# Patient Record
Sex: Female | Born: 1990 | Race: Black or African American | Hispanic: No | Marital: Single | Smoking: Former smoker
Health system: Southern US, Community
[De-identification: ages and names within clinical notes are randomized; demographics above are authoritative.]

## PROBLEM LIST (undated history)

## (undated) DIAGNOSIS — J40 Bronchitis, not specified as acute or chronic: Secondary | ICD-10-CM

## (undated) HISTORY — PX: INNER EAR SURGERY: SHX679

## (undated) HISTORY — PX: WISDOM TOOTH EXTRACTION: SHX21

## (undated) HISTORY — PX: EYE SURGERY: SHX253

---

## 2016-08-06 DIAGNOSIS — J209 Acute bronchitis, unspecified: Secondary | ICD-10-CM | POA: Insufficient documentation

## 2016-08-06 DIAGNOSIS — R079 Chest pain, unspecified: Secondary | ICD-10-CM | POA: Diagnosis present

## 2016-08-07 ENCOUNTER — Emergency Department (HOSPITAL_BASED_OUTPATIENT_CLINIC_OR_DEPARTMENT_OTHER): Payer: Medicaid Other

## 2016-08-07 ENCOUNTER — Emergency Department (HOSPITAL_BASED_OUTPATIENT_CLINIC_OR_DEPARTMENT_OTHER)
Admission: EM | Admit: 2016-08-07 | Discharge: 2016-08-07 | Disposition: A | Discharge status: Home / Self Care | Payer: Medicaid Other | Attending: Emergency Medicine | Admitting: Emergency Medicine

## 2016-08-07 ENCOUNTER — Encounter (HOSPITAL_BASED_OUTPATIENT_CLINIC_OR_DEPARTMENT_OTHER): Payer: Self-pay | Admitting: *Deleted

## 2016-08-07 DIAGNOSIS — J209 Acute bronchitis, unspecified: Secondary | ICD-10-CM

## 2016-08-07 MED ORDER — IPRATROPIUM-ALBUTEROL 0.5-2.5 (3) MG/3ML IN SOLN
3.0000 mL | RESPIRATORY_TRACT | Status: DC
  Administered 2016-08-07: 3 mL via RESPIRATORY_TRACT
  Filled 2016-08-07: qty 3

## 2016-08-07 MED ORDER — NAPROXEN 250 MG PO TABS
500.0000 mg | ORAL_TABLET | Freq: Once | ORAL | Status: AC
  Administered 2016-08-07: 500 mg via ORAL
  Filled 2016-08-07: qty 2

## 2016-08-07 MED ORDER — ALBUTEROL SULFATE HFA 108 (90 BASE) MCG/ACT IN AERS
2.0000 | INHALATION_SPRAY | RESPIRATORY_TRACT | Status: DC | PRN
  Administered 2016-08-07: 2 via RESPIRATORY_TRACT
  Filled 2016-08-07: qty 6.7

## 2016-08-07 NOTE — ED Triage Notes (Signed)
Pt was asleep in the chair in triage. States that she has had nausea, CP, HA x several days.

## 2016-08-07 NOTE — ED Notes (Signed)
Pt alert, NAD, calm, interactive, resps e/u, no dyspnea noted, resting comfortably, "feel much better", denies pain.

## 2016-08-07 NOTE — ED Notes (Signed)
Pt discharged to home NAD.  

## 2016-08-07 NOTE — ED Notes (Signed)
RT at BS.

## 2016-08-07 NOTE — ED Provider Notes (Signed)
[facility] Provider Note: Teresa DellJ. Lane Palmer Shorey, MD, FACEP  CSN: [id number] MRN: [id number] ARRIVAL: 08/06/16 at 2347 ROOM: [facility]   CHIEF COMPLAINT  Chest Pain   HISTORY OF PRESENT ILLNESS  Teresa Bradley is a 26 y.o. female who developed a respiratory illness about a week and a half ago. This is characterized by fever, cough, nasal congestion, vomiting and malaise. She self medicated with over-the-counter medications and was improving. About 4 days ago she worsened with nausea, vomiting, nonproductive cough, headache, shortness of breath and chest pain. The chest pain is located in the left upper chest anteriorly. The pain is described as dull, moderate and worse with coughing or deep breath. Her nausea and vomiting have subsequently resolved.   History reviewed. No pertinent past medical history.  History reviewed. No pertinent surgical history.  History reviewed. No pertinent family history.  Social History  Substance Use Topics  . Smoking status: Never Smoker  . Smokeless tobacco: Never Used  . Alcohol use No    Prior to Admission medications   Not on File    Allergies Patient has no known allergies.   REVIEW OF SYSTEMS  Negative except as noted here or in the History of Present Illness.   PHYSICAL EXAMINATION  Initial Vital Signs Blood pressure 112/74, pulse (!) 56, temperature 98.2 F (36.8 C), temperature source Oral, resp. rate 15, height 5\' 5"  (1.651 m), weight 200 lb (90.7 kg), SpO2 100 %.  Examination General: Well-developed, well-nourished female in no acute distress; appearance consistent with age of record HENT: normocephalic; atraumatic Eyes: pupils equal, round and reactive to light; extraocular muscles intact Neck: supple Heart: regular rate and rhythm Lungs: Decreased air movement bilaterally Chest: Nontender Abdomen: soft; nondistended; nontender; no masses or hepatosplenomegaly; bowel sounds present Extremities: No deformity; full  range of motion; pulses normal Neurologic: Awake, alert and oriented; motor function intact in all extremities and symmetric; no facial droop Skin: Warm and dry Psychiatric: Normal mood and affect   RESULTS  Summary of this visit's results, reviewed by myself:   EKG Interpretation  Date/Time:  Sunday August 07 2016 00:15:05 EST Ventricular Rate:  72 PR Interval:  126 QRS Duration: 70 QT Interval:  372 QTC Calculation: 407 R Axis:   77 Text Interpretation:  Normal sinus rhythm No previous ECGs available Confirmed by Marisella Puccio  MD, Jonny RuizJOHN ([id number]) on 08/07/2016 12:25:23 AM      Laboratory Studies: No results found for this or any previous visit (from the past 24 hour(s)). Imaging Studies: Dg Chest 2 View  Result Date: 08/07/2016 CLINICAL DATA:  Nausea, chest pain, shortness of breath, and headache for several days. EXAM: CHEST  2 VIEW COMPARISON:  None. FINDINGS: The heart size and mediastinal contours are within normal limits. Both lungs are clear. The visualized skeletal structures are unremarkable. IMPRESSION: No active cardiopulmonary disease. Electronically Signed   By: Burman NievesWilliam  Stevens M.D.   On: 08/07/2016 04:11    ED COURSE  Nursing notes and initial vitals signs, including pulse oximetry, reviewed.  Vitals:   08/07/16 0012 08/07/16 0013 08/07/16 0218 08/07/16 0407  BP: 115/75  112/74   Pulse: 73  (!) 56   Resp: 16  15   Temp: 98.2 F (36.8 C)     TempSrc: Oral     SpO2: 100%  100% 100%  Weight:  200 lb (90.7 kg)    Height:  5\' 5"  (1.651 m)     4:27 AM Air movement improved and patient feels significantly better after  DuoNeb treatment.  PROCEDURES    ED DIAGNOSES     ICD-9-CM ICD-10-CM   1. Acute bronchitis with bronchospasm 466.0 J20.9        Paula Libra, MD 08/07/16 [phone]

## 2017-04-29 ENCOUNTER — Emergency Department (HOSPITAL_BASED_OUTPATIENT_CLINIC_OR_DEPARTMENT_OTHER): Payer: BLUE CROSS/BLUE SHIELD

## 2017-04-29 ENCOUNTER — Emergency Department (HOSPITAL_BASED_OUTPATIENT_CLINIC_OR_DEPARTMENT_OTHER)
Admission: EM | Admit: 2017-04-29 | Discharge: 2017-04-29 | Disposition: A | Discharge status: Home / Self Care | Payer: BLUE CROSS/BLUE SHIELD | Attending: Physician Assistant | Admitting: Physician Assistant

## 2017-04-29 ENCOUNTER — Encounter (HOSPITAL_BASED_OUTPATIENT_CLINIC_OR_DEPARTMENT_OTHER): Payer: Self-pay | Admitting: Emergency Medicine

## 2017-04-29 DIAGNOSIS — O200 Threatened abortion: Secondary | ICD-10-CM | POA: Diagnosis not present

## 2017-04-29 DIAGNOSIS — Z87891 Personal history of nicotine dependence: Secondary | ICD-10-CM | POA: Diagnosis not present

## 2017-04-29 DIAGNOSIS — Z3A01 Less than 8 weeks gestation of pregnancy: Secondary | ICD-10-CM | POA: Diagnosis not present

## 2017-04-29 DIAGNOSIS — O4690 Antepartum hemorrhage, unspecified, unspecified trimester: Secondary | ICD-10-CM

## 2017-04-29 DIAGNOSIS — O4691 Antepartum hemorrhage, unspecified, first trimester: Secondary | ICD-10-CM | POA: Diagnosis present

## 2017-04-29 LAB — WET PREP, GENITAL
Sperm: NONE SEEN
Trich, Wet Prep: NONE SEEN
Yeast Wet Prep HPF POC: NONE SEEN

## 2017-04-29 LAB — ABO/RH: ABO/RH(D): A POS

## 2017-04-29 LAB — HCG, QUANTITATIVE, PREGNANCY: HCG, BETA CHAIN, QUANT, S: 6 m[IU]/mL — AB (ref <5)

## 2017-04-29 NOTE — ED Notes (Signed)
Pt unable to provide urine sample.

## 2017-04-29 NOTE — ED Triage Notes (Signed)
Pt c/o vaginal bleeding intermittently since Tues; c/o lower abd cramping; + home preg test

## 2017-04-29 NOTE — Discharge Instructions (Signed)
Please follow up with your OBGYN.  Your hormone levels are very low which makes us concerned that you are likely loosing this pregnancy, or it is VERY early.  We want you to follow up for repeat levels this week with your OBGYN.

## 2017-04-29 NOTE — ED Notes (Signed)
Patient transported to Ultrasound 

## 2017-04-29 NOTE — ED Notes (Signed)
PT returned from US.

## 2017-04-29 NOTE — ED Provider Notes (Signed)
[facility] EMERGENCY DEPARTMENT Provider Note   CSN: [id number] Arrival date & time: 04/29/17  1055     History   Chief Complaint Chief Complaint  Patient presents with  . Vaginal Bleeding    HPI Teresa Bradley is a 26 y.o. female.  HPI  Pt is a 26 yo female presenting with vaginal bleeding and new pregnancy. Pt has had one miscarriage prior to this pregnancy.  Patient believes that she is less than [redacted] weeks pregnant.  She called her OB who sent her here for evaluation.  Patient unsure of her blood type.  Patient had a abdominal cramping and bleeding starting 2 days ago.  It got worse and she had several blood clots today.    History reviewed. No pertinent past medical history.  There are no active problems to display for this patient.   Past Surgical History:  Procedure Laterality Date  . EYE SURGERY    . INNER EAR SURGERY      OB History    Gravida Para Term Preterm AB Living   1             SAB TAB Ectopic Multiple Live Births                   Home Medications    Prior to Admission medications   Not on File    Family History No family history on file.  Social History Social History  Substance Use Topics  . Smoking status: Former Games developer  . Smokeless tobacco: Never Used  . Alcohol use No     Allergies   Patient has no known allergies.   Review of Systems Review of Systems  Constitutional: Negative for activity change.  Respiratory: Negative for shortness of breath.   Cardiovascular: Negative for chest pain.  Gastrointestinal: Positive for abdominal pain.  Genitourinary: Positive for vaginal bleeding.  All other systems reviewed and are negative.    Physical Exam Updated Vital Signs BP 107/66 (BP Location: Right Arm)   Pulse 64   Temp 98.5 F (36.9 C) (Oral)   Resp 18   Ht 5\' 5"  (1.651 m)   Wt 90.7 kg (200 lb)   LMP 03/07/2017   SpO2 100%   BMI 33.28 kg/m   Physical Exam  Constitutional: She is oriented to person,  place, and time. She appears well-developed and well-nourished.  HENT:  Head: Normocephalic and atraumatic.  Eyes: Right eye exhibits no discharge.  Cardiovascular: Normal rate, regular rhythm and normal heart sounds.   No murmur heard. Pulmonary/Chest: Effort normal and breath sounds normal. She has no wheezes. She has no rales.  Abdominal: Soft. She exhibits no distension. There is no tenderness.  Genitourinary: Vagina normal.  Genitourinary Comments: Cervix closed, blood in vault.  Neurological: She is oriented to person, place, and time.  Skin: Skin is warm and dry. She is not diaphoretic.  Psychiatric: She has a normal mood and affect.  Nursing note and vitals reviewed.    ED Treatments / Results  Labs (all labs ordered are listed, but only abnormal results are displayed) Labs Reviewed  WET PREP, GENITAL - Abnormal; Notable for the following:       Result Value   Clue Cells Wet Prep HPF POC PRESENT (*)    WBC, Wet Prep HPF POC MODERATE (*)    All other components within normal limits  HCG, QUANTITATIVE, PREGNANCY - Abnormal; Notable for the following:    hCG, Beta Chain, Quant, S 6 (*)  All other components within normal limits  RPR  HIV ANTIBODY (ROUTINE TESTING)  ABO/RH  GC/CHLAMYDIA PROBE AMP ([place]) NOT AT [facility]    EKG  EKG Interpretation None       Radiology US Ob Comp Less 14 Wks  Result Date: 04/29/2017 CLINICAL DATA:  26 year old female with vaginal bleeding and cramping for 4 days. The patient's LMP was 12/12/2017. EXAM: ULTRASOUND PELVIS TRANSVAGINAL TECHNIQUE: Transvaginal ultrasound examination of the pelvis was performed including evaluation of the uterus, ovaries, adnexal regions, and pelvic cul-de-sac. COMPARISON:  None. FINDINGS: Uterus Measurements: 8.2 x 5.9 x 5.0 cm. No intrauterine gestational sac or fetal pole is identified. No fibroids or other mass visualized. Endometrium Thickness: 0.7 cm. No intrauterine gestational sac identified. No  other focal abnormality visualized. Right ovary Measurements: 3.7 x 2.5 x 1.9 cm. Normal appearance/no adnexal mass. Left ovary Measurements: 3.2 x 2.1 x 1.4 cm. Normal appearance/no adnexal mass. Other findings: No abnormal free fluid. Note is made of a few small nabothian cysts. IMPRESSION: 1. No sonographic evidence for intrauterine gestational sac. No abnormal pelvic free fluid or adnexal masses identified. Differential includes early nonvisualized pregnancy, recent miscarriage or nonvisualized ectopic pregnancy. 2. Recommend close clinical follow-up and serial beta hCGs. Electronically Signed   By: Sande Brothers M.D.   On: 04/29/2017 12:41   US Ob Transvaginal  Result Date: 04/29/2017 CLINICAL DATA:  26 year old female with vaginal bleeding and cramping for 4 days. The patient's LMP was 12/12/2017. EXAM: ULTRASOUND PELVIS TRANSVAGINAL TECHNIQUE: Transvaginal ultrasound examination of the pelvis was performed including evaluation of the uterus, ovaries, adnexal regions, and pelvic cul-de-sac. COMPARISON:  None. FINDINGS: Uterus Measurements: 8.2 x 5.9 x 5.0 cm. No intrauterine gestational sac or fetal pole is identified. No fibroids or other mass visualized. Endometrium Thickness: 0.7 cm. No intrauterine gestational sac identified. No other focal abnormality visualized. Right ovary Measurements: 3.7 x 2.5 x 1.9 cm. Normal appearance/no adnexal mass. Left ovary Measurements: 3.2 x 2.1 x 1.4 cm. Normal appearance/no adnexal mass. Other findings: No abnormal free fluid. Note is made of a few small nabothian cysts. IMPRESSION: 1. No sonographic evidence for intrauterine gestational sac. No abnormal pelvic free fluid or adnexal masses identified. Differential includes early nonvisualized pregnancy, recent miscarriage or nonvisualized ectopic pregnancy. 2. Recommend close clinical follow-up and serial beta hCGs. Electronically Signed   By: Sande Brothers M.D.   On: 04/29/2017 12:41    Procedures Procedures  (including critical care time)  Medications Ordered in ED Medications - No data to display   Initial Impression / Assessment and Plan / ED Course  I have reviewed the triage vital signs and the nursing notes.  Pertinent labs & imaging results that were available during my care of the patient were reviewed by me and considered in my medical decision making (see chart for details).     Pt is a 26 yo female presenting with vaginal bleeding and new pregnancy. Pt has had one miscarriage prior to this pregnancy.  Patient believes that she is less than [redacted] weeks pregnant.  She called her OB who sent her here for evaluation.  Patient unsure of her blood type.  Patient had a abdominal cramping and bleeding starting 2 days ago.  It got worse and she had several blood clots today.   3:17 PM Will get blood type,beta hcg, transvaginal ultrasound.  Likely threatened miscarriage.  Very low hcg, transitional shows no IUP, likely a threatened/near complete miscarriage we will have her follow-up with OB/GYN this week.  Patient B+, no RhoGam needed.  Final Clinical Impressions(s) / ED Diagnoses   Final diagnoses:  Vaginal bleeding in pregnancy  Threatened miscarriage    New Prescriptions There are no discharge medications for this patient.    Abelino DerrickMackuen, Quinci Gavidia Lyn, MD 04/29/17 1517

## 2017-04-30 LAB — HIV ANTIBODY (ROUTINE TESTING W REFLEX): HIV Screen 4th Generation wRfx: NONREACTIVE

## 2017-04-30 LAB — RPR: RPR Ser Ql: NONREACTIVE

## 2017-05-01 LAB — GC/CHLAMYDIA PROBE AMP (~~LOC~~) NOT AT ARMC
Chlamydia: NEGATIVE
NEISSERIA GONORRHEA: NEGATIVE

## 2018-06-07 ENCOUNTER — Emergency Department (HOSPITAL_BASED_OUTPATIENT_CLINIC_OR_DEPARTMENT_OTHER): Payer: Medicaid Other

## 2018-06-07 ENCOUNTER — Other Ambulatory Visit: Payer: Self-pay

## 2018-06-07 ENCOUNTER — Emergency Department (HOSPITAL_BASED_OUTPATIENT_CLINIC_OR_DEPARTMENT_OTHER)
Admission: EM | Admit: 2018-06-07 | Discharge: 2018-06-07 | Disposition: A | Discharge status: Home / Self Care | Payer: Medicaid Other | Attending: Emergency Medicine | Admitting: Emergency Medicine

## 2018-06-07 ENCOUNTER — Encounter (HOSPITAL_BASED_OUTPATIENT_CLINIC_OR_DEPARTMENT_OTHER): Payer: Self-pay | Admitting: *Deleted

## 2018-06-07 DIAGNOSIS — R05 Cough: Secondary | ICD-10-CM

## 2018-06-07 DIAGNOSIS — R059 Cough, unspecified: Secondary | ICD-10-CM

## 2018-06-07 DIAGNOSIS — R0981 Nasal congestion: Secondary | ICD-10-CM

## 2018-06-07 NOTE — Discharge Instructions (Signed)
It was my pleasure taking care of you today!   Fortunately, your x-ray was normal. We did not see evidence of serious infection and can treat your symptoms. Flonase for nasal congestion.   Rest, drink plenty of fluids to be sure you are staying hydrated.   Please follow up with your primary doctor for discussion of your diagnoses and further evaluation after today's visit if symptoms persist longer than 7 days; Return to the ER for high fevers, difficulty breathing or other concerning symptoms

## 2018-06-07 NOTE — ED Provider Notes (Signed)
[facility] EMERGENCY DEPARTMENT Provider Note   CSN: [id number] Arrival date & time: 06/07/18  2009     History   Chief Complaint Chief Complaint  Patient presents with  . Nasal Congestion    HPI Teresa Bradley is a 27 y.o. female.  The history is provided by the patient and medical records. No language interpreter was used.   Teresa Bradley is an otherwise healthy 27 year old female who presents to the emergency department for cough and congestion for the last 6 days.  Associated with sore throat and hoarseness. No medications taken prior to arrival for symptoms.  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 She feels as if this is contributing to her sore throat and hoarseness. Denies fever or chest. No chest pain or shortness of breath.    History reviewed. No pertinent past medical history.  There are no active problems to display for this patient.   Past Surgical History:  Procedure Laterality Date  . EYE SURGERY    . INNER EAR SURGERY       OB History    Gravida  1   Para      Term      Preterm      AB      Living        SAB      TAB      Ectopic      Multiple      Live Births               Home Medications    Prior to Admission medications   Not on File    Family History No family history on file.  Social History Social History   Tobacco Use  . Smoking status: Former Games developermoker  . Smokeless tobacco: Never Used  Substance Use Topics  . Alcohol use: No  . Drug use: No     Allergies   Patient has no known allergies.   Review of Systems Review of Systems  HENT: Positive for congestion and sore throat.   Respiratory: Positive for cough. Negative for shortness of breath.   Cardiovascular: Negative for chest pain, palpitations and leg swelling.  All other systems reviewed and are negative.    Physical Exam Updated Vital Signs BP 110/83   Pulse 66   Temp 98.2  F (36.8 C) (Oral)   Resp 18   Ht 5\' 5"  (1.651 m)   Wt 98.9 kg   LMP 05/24/2018   SpO2 99%   Breastfeeding Unknown   BMI 36.28 kg/m   Physical Exam Vitals signs and nursing note reviewed.  Constitutional:      General: She is not in acute distress.    Appearance: She is well-developed. She is not diaphoretic.     Comments: Nontoxic appearing.  HENT:     Head: Normocephalic and atraumatic.     Nose: Congestion present.     Comments: No tonsillar hypertrophy or exudates. Neck:     Musculoskeletal: Normal range of motion and neck supple.  Cardiovascular:     Rate and Rhythm: Normal rate and regular rhythm.     Heart sounds: Normal heart sounds.  Pulmonary:     Effort: Pulmonary effort is normal.     Comments: Lungs are clear to auscultation bilaterally. Abdominal:     General: There is no distension.     Palpations: Abdomen is soft.     Tenderness: There  is no abdominal tenderness.  Musculoskeletal: Normal range of motion.  Skin:    General: Skin is warm and dry.  Neurological:     Mental Status: She is alert and oriented to person, place, and time.      ED Treatments / Results  Labs (all labs ordered are listed, but only abnormal results are displayed) Labs Reviewed - No data to display  EKG None  Radiology Dg Chest 2 View  Result Date: 06/07/2018 CLINICAL DATA:  Cough and congestion EXAM: CHEST - 2 VIEW COMPARISON:  08/07/2016 FINDINGS: The heart size and mediastinal contours are within normal limits. Both lungs are clear. The visualized skeletal structures are unremarkable. IMPRESSION: No active cardiopulmonary disease. Electronically Signed   By: Jasmine Pang M.D.   On: 06/07/2018 22:28    Procedures Procedures (including critical care time)  Medications Ordered in ED Medications - No data to display   Initial Impression / Assessment and Plan / ED Course  I have reviewed the triage vital signs and the nursing notes.  Pertinent labs & imaging  results that were available during my care of the patient were reviewed by me and considered in my medical decision making (see chart for details).    Teresa Bradley is a 27 y.o. female who presents to ED for cough, congestion, sore throat and hoarse voice for the last several days.   On exam, patient is afebrile, non-toxic appearing with a clear lung exam. Mild rhinorrhea and OP with erythema but no exudates or tonsillar hypertrophy.  CXR negative.   Sxs today likely due to viral URI.Symptomatic home care instructions discussed.  Recommended Flonase for nasal congestion. PCP follow up strongly encouraged if symptoms persist. Reasons to return to ER discussed. All questions answered.   Blood pressure 110/83, pulse 66, temperature 98.2 F (36.8 C), temperature source Oral, resp. rate 18, height 5\' 5"  (1.651 m), weight 98.9 kg, last menstrual period 05/24/2018, SpO2 99 %, unknown if currently breastfeeding.  Final Clinical Impressions(s) / ED Diagnoses   Final diagnoses:  Cough  Nasal congestion    ED Discharge Orders    None       Tavarus Poteete, Chase Picket, PA-C 06/07/18 2252    Little, Ambrose Finland, MD 06/09/18 1123

## 2018-06-07 NOTE — ED Triage Notes (Signed)
Congestion, laryngitis and cough.

## 2019-04-16 ENCOUNTER — Ambulatory Visit (HOSPITAL_BASED_OUTPATIENT_CLINIC_OR_DEPARTMENT_OTHER)
Admission: RE | Admit: 2019-04-16 | Discharge: 2019-04-16 | Disposition: A | Discharge status: Home / Self Care | Payer: Self-pay | Source: Ambulatory Visit | Attending: Emergency Medicine | Admitting: Emergency Medicine

## 2019-04-16 ENCOUNTER — Other Ambulatory Visit: Payer: Self-pay

## 2019-04-16 ENCOUNTER — Emergency Department (HOSPITAL_BASED_OUTPATIENT_CLINIC_OR_DEPARTMENT_OTHER)
Admission: EM | Admit: 2019-04-16 | Discharge: 2019-04-16 | Disposition: A | Discharge status: Home / Self Care | Payer: Medicaid Other | Attending: Emergency Medicine | Admitting: Emergency Medicine

## 2019-04-16 ENCOUNTER — Encounter (HOSPITAL_BASED_OUTPATIENT_CLINIC_OR_DEPARTMENT_OTHER): Payer: Self-pay | Admitting: Emergency Medicine

## 2019-04-16 ENCOUNTER — Encounter (HOSPITAL_BASED_OUTPATIENT_CLINIC_OR_DEPARTMENT_OTHER): Payer: Self-pay

## 2019-04-16 DIAGNOSIS — O99331 Smoking (tobacco) complicating pregnancy, first trimester: Secondary | ICD-10-CM | POA: Insufficient documentation

## 2019-04-16 DIAGNOSIS — F17200 Nicotine dependence, unspecified, uncomplicated: Secondary | ICD-10-CM | POA: Insufficient documentation

## 2019-04-16 DIAGNOSIS — R109 Unspecified abdominal pain: Secondary | ICD-10-CM | POA: Insufficient documentation

## 2019-04-16 DIAGNOSIS — R52 Pain, unspecified: Secondary | ICD-10-CM | POA: Insufficient documentation

## 2019-04-16 DIAGNOSIS — O99891 Other specified diseases and conditions complicating pregnancy: Secondary | ICD-10-CM | POA: Insufficient documentation

## 2019-04-16 DIAGNOSIS — R112 Nausea with vomiting, unspecified: Secondary | ICD-10-CM | POA: Insufficient documentation

## 2019-04-16 DIAGNOSIS — Z3490 Encounter for supervision of normal pregnancy, unspecified, unspecified trimester: Secondary | ICD-10-CM

## 2019-04-16 LAB — COMPREHENSIVE METABOLIC PANEL
ALT: 11 U/L (ref 0–44)
AST: 15 U/L (ref 15–41)
Albumin: 3.7 g/dL (ref 3.5–5.0)
Alkaline Phosphatase: 41 U/L (ref 38–126)
Anion gap: 7 (ref 5–15)
BUN: 14 mg/dL (ref 6–20)
CO2: 24 mmol/L (ref 22–32)
Calcium: 8.9 mg/dL (ref 8.9–10.3)
Chloride: 104 mmol/L (ref 98–111)
Creatinine, Ser: 1.01 mg/dL — ABNORMAL HIGH (ref 0.44–1.00)
GFR calc Af Amer: >60 mL/min (ref >60)
GFR calc non Af Amer: >60 mL/min (ref >60)
Glucose, Bld: 113 mg/dL — ABNORMAL HIGH (ref 70–99)
Potassium: 3.9 mmol/L (ref 3.5–5.1)
Sodium: 135 mmol/L (ref 135–145)
Total Bilirubin: 0.5 mg/dL (ref 0.3–1.2)
Total Protein: 7.4 g/dL (ref 6.5–8.1)

## 2019-04-16 LAB — URINALYSIS, ROUTINE W REFLEX MICROSCOPIC
Bilirubin Urine: NEGATIVE
Glucose, UA: NEGATIVE mg/dL
Ketones, ur: NEGATIVE mg/dL
Leukocytes,Ua: NEGATIVE
Nitrite: NEGATIVE
Protein, ur: NEGATIVE mg/dL
Specific Gravity, Urine: 1.02 (ref 1.005–1.030)
pH: 6 (ref 5.0–8.0)

## 2019-04-16 LAB — WET PREP, GENITAL
Clue Cells Wet Prep HPF POC: NONE SEEN
Sperm: NONE SEEN
Trich, Wet Prep: NONE SEEN
Yeast Wet Prep HPF POC: NONE SEEN

## 2019-04-16 LAB — CBC WITH DIFFERENTIAL/PLATELET
Abs Immature Granulocytes: 0.02 10*3/uL (ref 0.00–0.07)
Basophils Absolute: 0 10*3/uL (ref 0.0–0.1)
Basophils Relative: 0 %
Eosinophils Absolute: 0.3 10*3/uL (ref 0.0–0.5)
Eosinophils Relative: 3 %
HCT: 38.4 % (ref 36.0–46.0)
Hemoglobin: 12.6 g/dL (ref 12.0–15.0)
Immature Granulocytes: 0 %
Lymphocytes Relative: 42 %
Lymphs Abs: 3.8 10*3/uL (ref 0.7–4.0)
MCH: 28.9 pg (ref 26.0–34.0)
MCHC: 32.8 g/dL (ref 30.0–36.0)
MCV: 88.1 fL (ref 80.0–100.0)
Monocytes Absolute: 0.6 10*3/uL (ref 0.1–1.0)
Monocytes Relative: 7 %
Neutro Abs: 4.4 10*3/uL (ref 1.7–7.7)
Neutrophils Relative %: 48 %
Platelets: 261 10*3/uL (ref 150–400)
RBC: 4.36 MIL/uL (ref 3.87–5.11)
RDW: 12.8 % (ref 11.5–15.5)
WBC: 9.1 10*3/uL (ref 4.0–10.5)
nRBC: 0 % (ref 0.0–0.2)

## 2019-04-16 LAB — URINALYSIS, MICROSCOPIC (REFLEX)

## 2019-04-16 LAB — HCG, QUANTITATIVE, PREGNANCY: hCG, Beta Chain, Quant, S: 1041 m[IU]/mL — ABNORMAL HIGH (ref <5)

## 2019-04-16 LAB — HIV ANTIBODY (ROUTINE TESTING W REFLEX): HIV Screen 4th Generation wRfx: NONREACTIVE

## 2019-04-16 LAB — RPR: RPR Ser Ql: NONREACTIVE

## 2019-04-16 MED ORDER — LACTATED RINGERS IV BOLUS
1000.0000 mL | Freq: Once | INTRAVENOUS | Status: AC
Start: 2019-04-16 — End: 2019-04-16
  Administered 2019-04-16: 1000 mL via INTRAVENOUS

## 2019-04-16 MED ORDER — ONDANSETRON HCL 4 MG/2ML IJ SOLN
4.0000 mg | Freq: Once | INTRAMUSCULAR | Status: AC
  Administered 2019-04-16: 4 mg via INTRAVENOUS
  Filled 2019-04-16: qty 2

## 2019-04-16 NOTE — ED Triage Notes (Addendum)
Pt states she is pregnant, unknown how far along she is and has been having lower abd pain today  Pt denies any spotting or bleeding  Pt states she has had chills today as well  Pt states she has been having off and on nausea for about a week  Pt is requesting a covid test, explained we do not do them here unless you are displaying symptoms, pt then states she was exposed to it by a coworker recently

## 2019-04-16 NOTE — ED Notes (Signed)
ED Provider at bedside. 

## 2019-04-16 NOTE — ED Provider Notes (Signed)
Emergency Department Provider Note   I have reviewed the triage vital signs and the nursing notes.   HISTORY  Chief Complaint Abdominal Pain   HPI Teresa Bradley is a 28 y.o. female have a couple days of pelvic pain.  Patient states that her last menstrual cycle was on the 16th 19 September.  She is taken multiple home pregnancy test positive.  Has not had any vaginal bleeding or discharge.  No urinary symptoms.  No trauma.  No GI symptoms such as nausea, vomiting, diarrhea or constipation.  No systemic symptoms such as fever or malaise.  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  She denies her healthy testes being pregnant for she has only has had 1 live birth.  Some swelling they have been therapeutic abortions.   No other associated or modifying symptoms.    History reviewed. No pertinent past medical history.  There are no active problems to display for this patient.   Past Surgical History:  Procedure Laterality Date  . EYE SURGERY    . INNER EAR SURGERY        Allergies Patient has no known allergies.  Family History  Problem Relation Age of Onset  . Cancer Other   . Stroke Other   . Diabetes Other   . Hypertension Other     Social History Social History   Tobacco Use  . Smoking status: Current Every Day Smoker  . Smokeless tobacco: Never Used  Substance Use Topics  . Alcohol use: Yes    Comment: social  . Drug use: No    Review of Systems  All other systems negative except as documented in the HPI. All pertinent positives and negatives as reviewed in the HPI. ____________________________________________   PHYSICAL EXAM:  VITAL SIGNS: ED Triage Vitals  Enc Vitals Group     BP 04/16/19 0021 123/75     Pulse Rate 04/16/19 0021 83     Resp 04/16/19 0021 16     Temp 04/16/19 0021 98.5 F (36.9 C)     Temp Source 04/16/19 0021 Oral     SpO2 04/16/19 0021 99 %     Weight 04/16/19 0017 224 lb (101.6 kg)     Height 04/16/19 0017 5\' 6"   (1.676 m)    Constitutional: Alert and oriented. Well appearing and in no acute distress. Eyes: Conjunctivae are normal. PERRL. EOMI. Head: Atraumatic. Nose: No congestion/rhinnorhea. Mouth/Throat: Mucous membranes are moist.  Oropharynx non-erythematous. Neck: No stridor.  No meningeal signs.   Cardiovascular: Normal rate, regular rhythm. Good peripheral circulation. Grossly normal heart sounds.   Respiratory: Normal respiratory effort.  No retractions. Lungs CTAB. Gastrointestinal: Soft and nontender. No distention.  Musculoskeletal: No lower extremity tenderness nor edema. No gross deformities of extremities. Neurologic:  Normal speech and language. No gross focal neurologic deficits are appreciated.  Skin:  Skin is warm, dry and intact. No rash noted.   ____________________________________________   LABS (all labs ordered are listed, but only abnormal results are displayed)  Labs Reviewed  WET PREP, GENITAL - Abnormal; Notable for the following components:      Result Value   WBC, Wet Prep HPF POC FEW (*)    All other components within normal limits  URINALYSIS, ROUTINE W REFLEX MICROSCOPIC - Abnormal; Notable for the following components:   Hgb urine dipstick TRACE (*)    All other components within normal limits  COMPREHENSIVE METABOLIC PANEL - Abnormal; Notable for the following components:   Glucose, Bld 113 (*)  Creatinine, Ser 1.01 (*)    All other components within normal limits  HCG, QUANTITATIVE, PREGNANCY - Abnormal; Notable for the following components:   hCG, Beta Chain, Quant, S 1,041 (*)    All other components within normal limits  URINALYSIS, MICROSCOPIC (REFLEX) - Abnormal; Notable for the following components:   Bacteria, UA FEW (*)    All other components within normal limits  CBC WITH DIFFERENTIAL/PLATELET  RPR  HIV ANTIBODY (ROUTINE TESTING W REFLEX)  GC/CHLAMYDIA PROBE AMP ([place]) NOT AT [facility]   ____________________________________________   PROCEDURES  Procedure(s) performed:   Pelvic exam  Date/Time: 04/16/2019 5:57 AM Performed by: Marily Memos, MD Authorized by: Marily Memos, MD  Consent: Verbal consent obtained. Risks and benefits: risks, benefits and alternatives were discussed Consent given by: patient Patient understanding: patient states understanding of the procedure being performed Patient consent: the patient's understanding of the procedure matches consent given Relevant documents: relevant documents present and verified Test results: test results available and properly labeled Required items: required blood products, implants, devices, and special equipment available Patient identity confirmed: verbally with patient Time out: Immediately prior to procedure a "time out" was called to verify the correct patient, procedure, equipment, support staff and site/side marked as required. Preparation: Patient was prepped and draped in the usual sterile fashion. Local anesthesia used: no  Anesthesia: Local anesthesia used: no  Sedation: Patient sedated: no  Patient tolerance: patient tolerated the procedure well with no immediate complications Comments: Chaperoned by nurse: Caryl Asp      ____________________________________________   INITIAL IMPRESSION / ASSESSMENT AND PLAN / ED COURSE  Pregnancy of unknown location. No e/o ruptured ectopic. No e/o for STD/PID or UTI. Will return in AM for [place]. Ob follow up.      Pertinent labs & imaging results that were available during my care of the patient were reviewed by me and considered in my medical decision making (see chart for details).  A medical screening exam was performed and I feel the patient has had an appropriate workup for their chief complaint at this time and likelihood of emergent condition existing is low. They have been counseled on decision, discharge, follow up and which symptoms necessitate immediate return to the emergency department. They  or their family verbally stated understanding and agreement with plan and discharged in stable condition.   ____________________________________________  FINAL CLINICAL IMPRESSION(S) / ED DIAGNOSES  Final diagnoses:  Abdominal pain, unspecified abdominal location  Pregnancy, unspecified gestational age     MEDICATIONS GIVEN DURING THIS VISIT:  Medications  ondansetron (ZOFRAN) injection 4 mg (4 mg Intravenous Given 04/16/19 0325)  lactated ringers bolus 1,000 mL (0 mLs Intravenous Stopped 04/16/19 0424)     NEW OUTPATIENT MEDICATIONS STARTED DURING THIS VISIT:  There are no discharge medications for this patient.   Note:  This note was prepared with assistance of Dragon voice recognition software. Occasional wrong-word or sound-a-like substitutions may have occurred due to the inherent limitations of voice recognition software.   Jamesen Stahnke, Barbara Cower, MD 04/16/19 [phone]

## 2019-04-17 LAB — GC/CHLAMYDIA PROBE AMP (~~LOC~~) NOT AT ARMC
Chlamydia: NEGATIVE
Neisseria Gonorrhea: NEGATIVE

## 2019-08-04 IMAGING — CR DG CHEST 2V
2 series · 2 of 2 positions shown · non-contrast
Comparison: 08/07/2016

CLINICAL DATA: Cough and congestion

EXAM:
CHEST - 2 VIEW

[w chest pa]
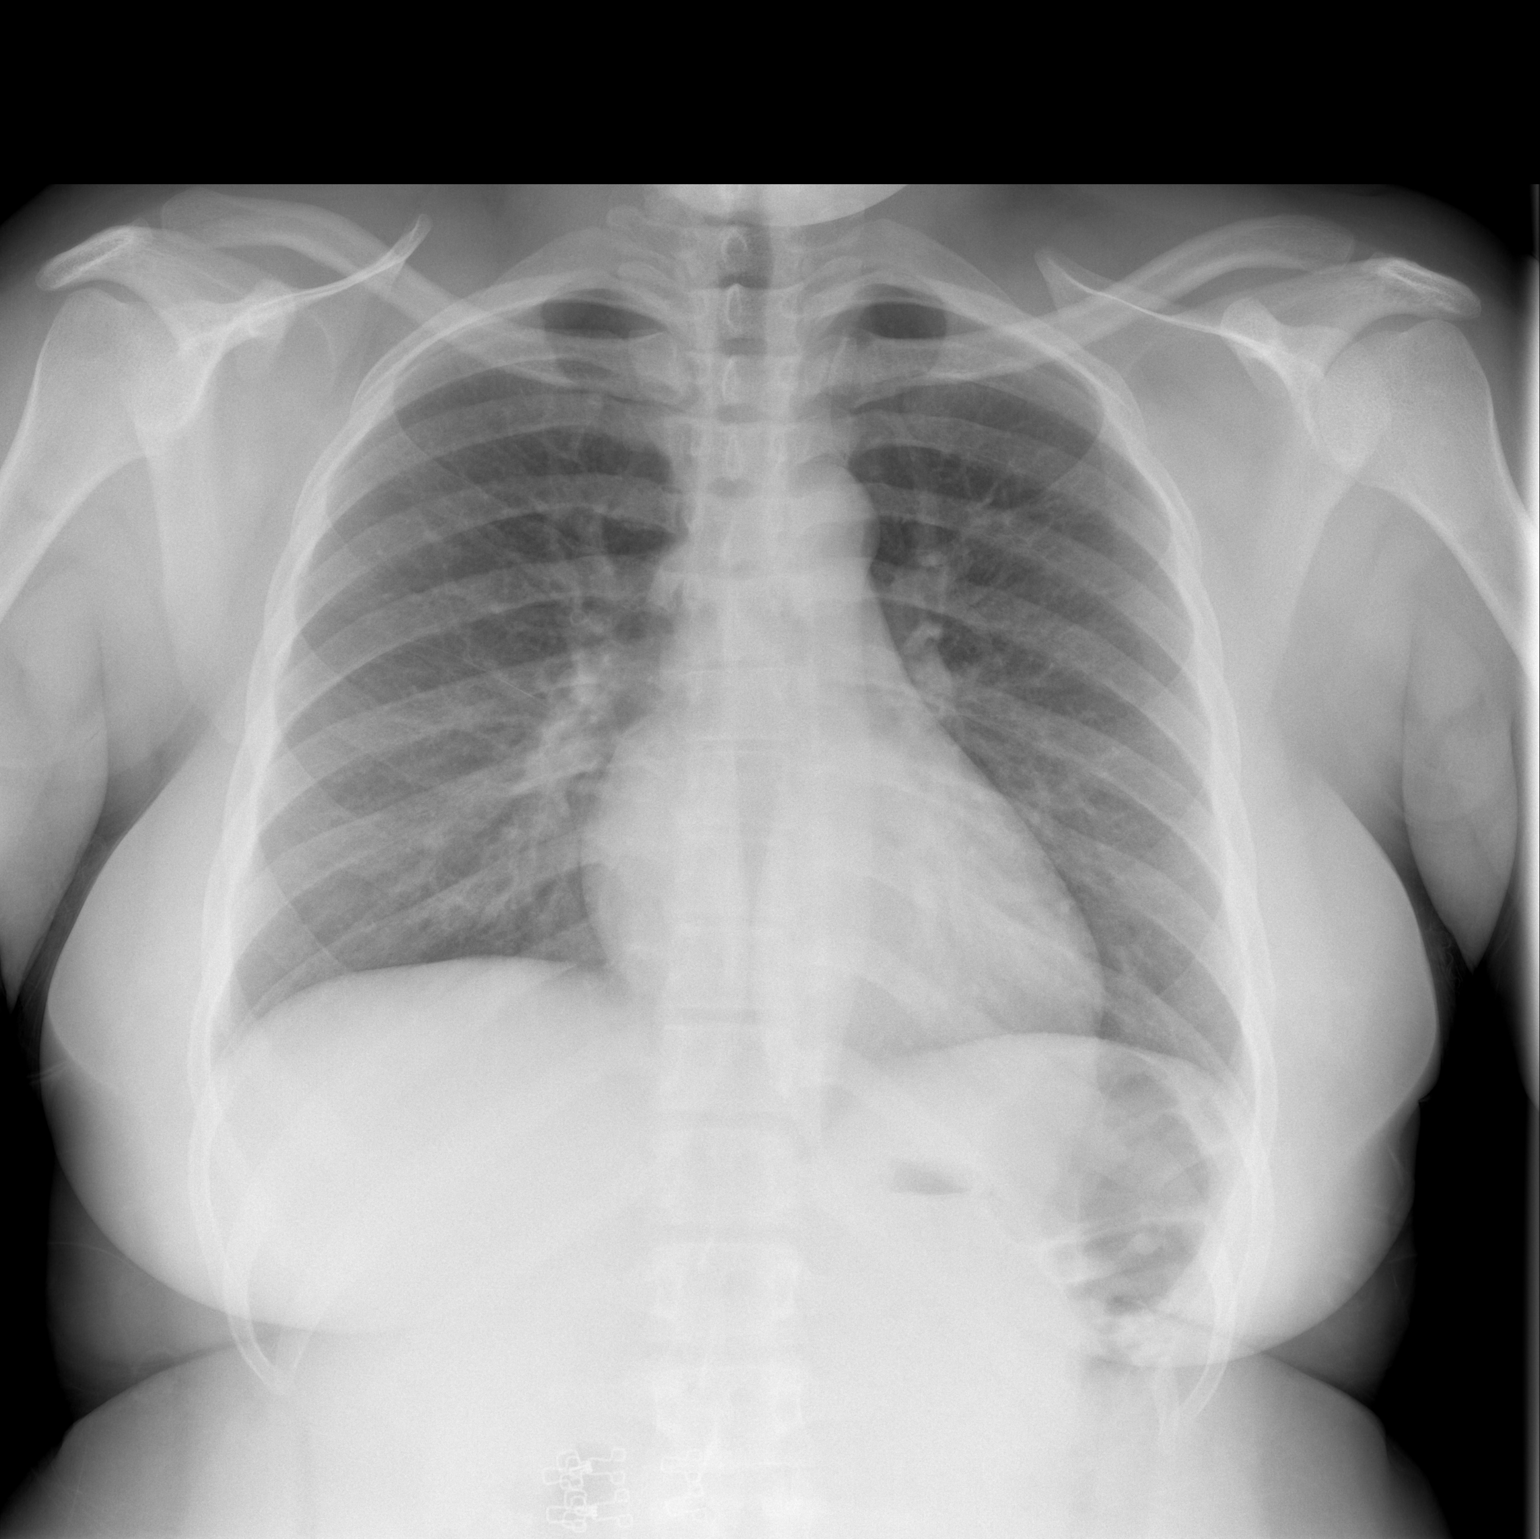

[w chest lat]
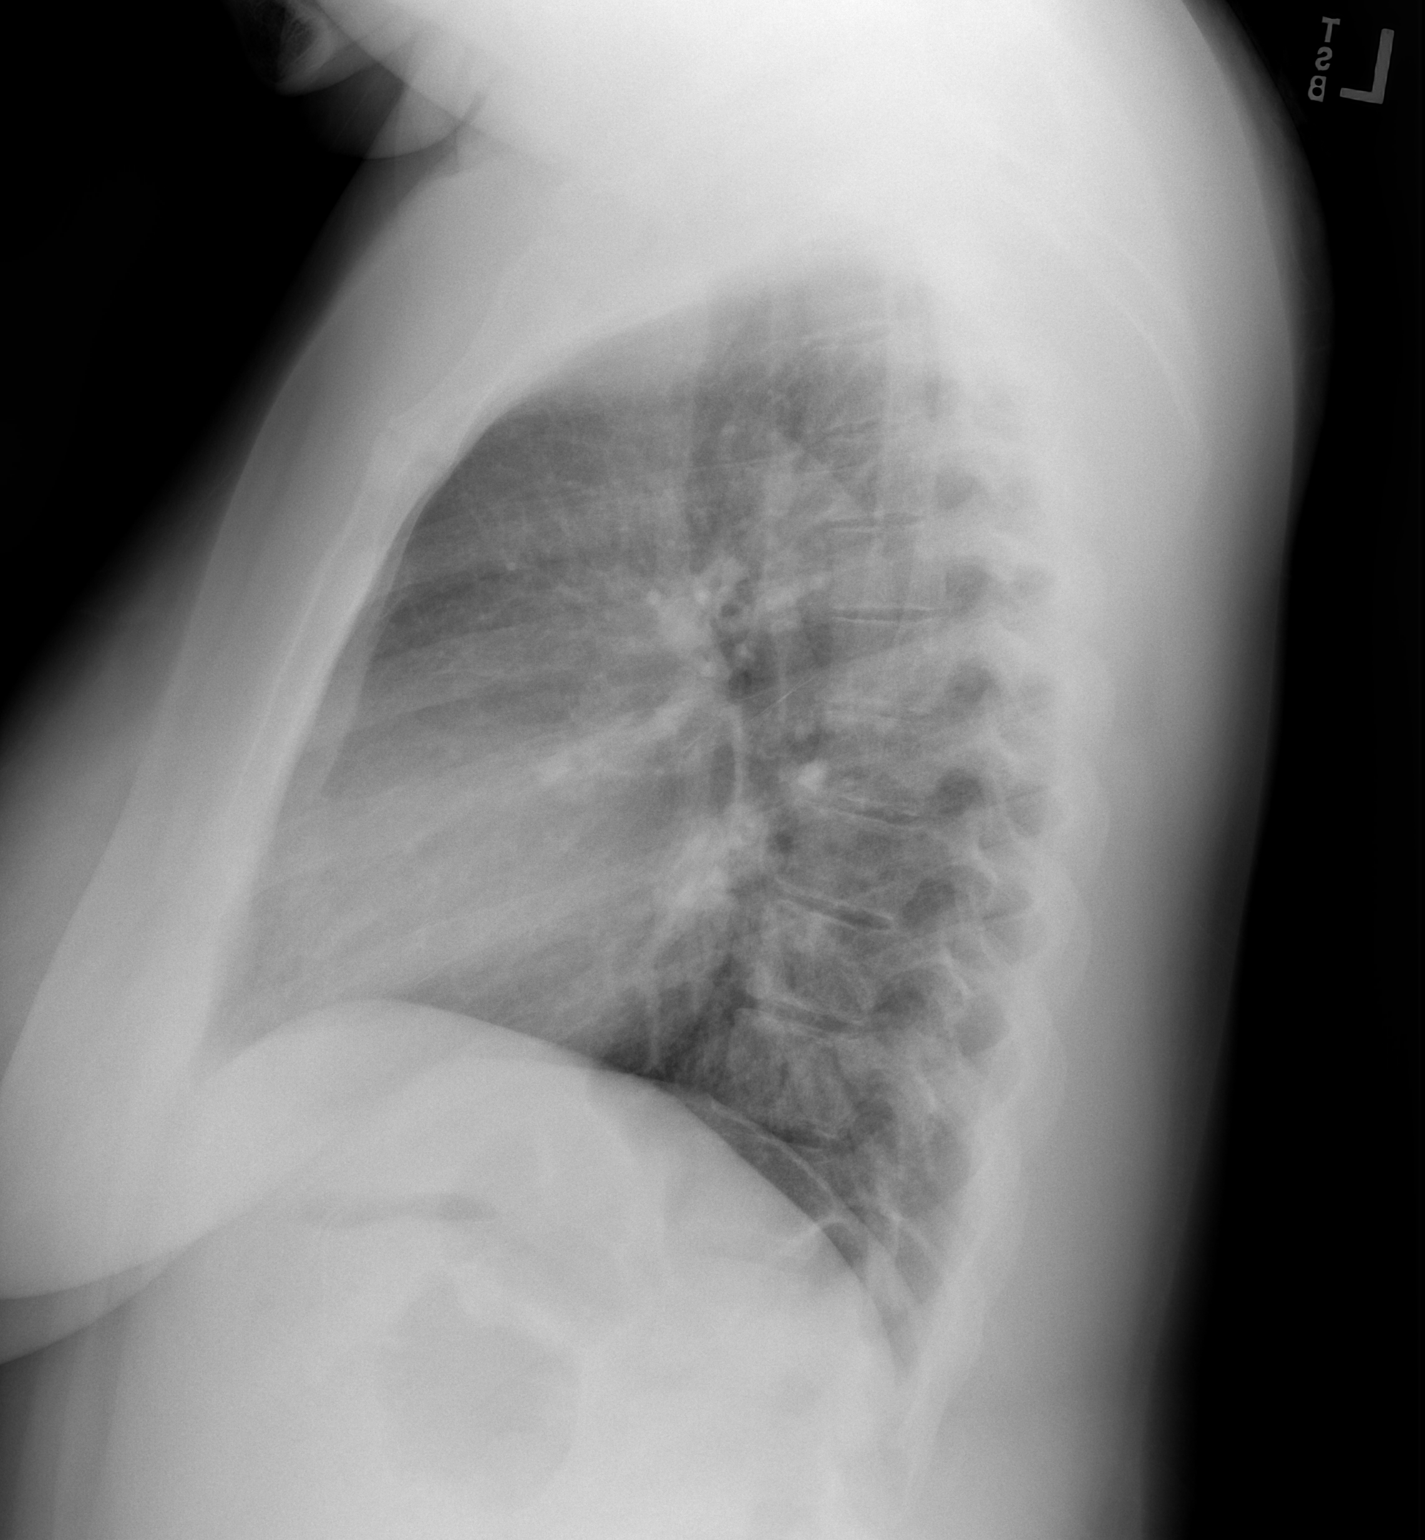

[2 of 2 positions shown; findings below may reference images not displayed]

FINDINGS: The heart size and mediastinal contours are within normal limits.
Both lungs are clear. The visualized skeletal structures are
unremarkable.
IMPRESSION: No active cardiopulmonary disease.

## 2019-10-04 ENCOUNTER — Other Ambulatory Visit: Payer: Self-pay

## 2019-10-04 ENCOUNTER — Encounter (HOSPITAL_COMMUNITY): Payer: Self-pay | Admitting: Obstetrics and Gynecology

## 2019-10-04 ENCOUNTER — Inpatient Hospital Stay (HOSPITAL_COMMUNITY)
Admission: AD | Admit: 2019-10-04 | Discharge: 2019-10-04 | Disposition: A | Discharge status: Home / Self Care | Payer: Medicaid Other | Attending: Obstetrics & Gynecology | Admitting: Obstetrics & Gynecology

## 2019-10-04 ENCOUNTER — Inpatient Hospital Stay (HOSPITAL_COMMUNITY): Payer: Medicaid Other

## 2019-10-04 DIAGNOSIS — O98311 Other infections with a predominantly sexual mode of transmission complicating pregnancy, first trimester: Secondary | ICD-10-CM | POA: Diagnosis not present

## 2019-10-04 DIAGNOSIS — Z3A10 10 weeks gestation of pregnancy: Secondary | ICD-10-CM | POA: Insufficient documentation

## 2019-10-04 DIAGNOSIS — O219 Vomiting of pregnancy, unspecified: Secondary | ICD-10-CM

## 2019-10-04 DIAGNOSIS — Z3491 Encounter for supervision of normal pregnancy, unspecified, first trimester: Secondary | ICD-10-CM

## 2019-10-04 DIAGNOSIS — A5901 Trichomonal vulvovaginitis: Secondary | ICD-10-CM | POA: Insufficient documentation

## 2019-10-04 DIAGNOSIS — Z87891 Personal history of nicotine dependence: Secondary | ICD-10-CM | POA: Insufficient documentation

## 2019-10-04 DIAGNOSIS — O209 Hemorrhage in early pregnancy, unspecified: Secondary | ICD-10-CM | POA: Diagnosis present

## 2019-10-04 DIAGNOSIS — O210 Mild hyperemesis gravidarum: Secondary | ICD-10-CM | POA: Insufficient documentation

## 2019-10-04 DIAGNOSIS — O23591 Infection of other part of genital tract in pregnancy, first trimester: Secondary | ICD-10-CM

## 2019-10-04 DIAGNOSIS — O4690 Antepartum hemorrhage, unspecified, unspecified trimester: Secondary | ICD-10-CM

## 2019-10-04 HISTORY — DX: Bronchitis, not specified as acute or chronic: J40

## 2019-10-04 LAB — URINALYSIS, ROUTINE W REFLEX MICROSCOPIC
Bilirubin Urine: NEGATIVE
Glucose, UA: NEGATIVE mg/dL
Hgb urine dipstick: NEGATIVE
Ketones, ur: NEGATIVE mg/dL
Nitrite: NEGATIVE
Protein, ur: NEGATIVE mg/dL
Specific Gravity, Urine: 1.014 (ref 1.005–1.030)
pH: 5 (ref 5.0–8.0)

## 2019-10-04 LAB — COMPREHENSIVE METABOLIC PANEL
ALT: 13 U/L (ref 0–44)
AST: 16 U/L (ref 15–41)
Albumin: 3.2 g/dL — ABNORMAL LOW (ref 3.5–5.0)
Alkaline Phosphatase: 36 U/L — ABNORMAL LOW (ref 38–126)
Anion gap: 8 (ref 5–15)
BUN: 7 mg/dL (ref 6–20)
CO2: 25 mmol/L (ref 22–32)
Calcium: 8.8 mg/dL — ABNORMAL LOW (ref 8.9–10.3)
Chloride: 103 mmol/L (ref 98–111)
Creatinine, Ser: 0.98 mg/dL (ref 0.44–1.00)
GFR calc Af Amer: >60 mL/min (ref >60)
GFR calc non Af Amer: >60 mL/min (ref >60)
Glucose, Bld: 114 mg/dL — ABNORMAL HIGH (ref 70–99)
Potassium: 3.8 mmol/L (ref 3.5–5.1)
Sodium: 136 mmol/L (ref 135–145)
Total Bilirubin: 0.5 mg/dL (ref 0.3–1.2)
Total Protein: 6.7 g/dL (ref 6.5–8.1)

## 2019-10-04 LAB — CBC
HCT: 35.9 % — ABNORMAL LOW (ref 36.0–46.0)
Hemoglobin: 12.1 g/dL (ref 12.0–15.0)
MCH: 28.5 pg (ref 26.0–34.0)
MCHC: 33.7 g/dL (ref 30.0–36.0)
MCV: 84.7 fL (ref 80.0–100.0)
Platelets: 262 10*3/uL (ref 150–400)
RBC: 4.24 MIL/uL (ref 3.87–5.11)
RDW: 13.3 % (ref 11.5–15.5)
WBC: 6.2 10*3/uL (ref 4.0–10.5)
nRBC: 0 % (ref 0.0–0.2)

## 2019-10-04 LAB — I-STAT BETA HCG BLOOD, ED (MC, WL, AP ONLY): I-stat hCG, quantitative: >2000 m[IU]/mL — ABNORMAL HIGH (ref <5)

## 2019-10-04 LAB — WET PREP, GENITAL
Clue Cells Wet Prep HPF POC: NONE SEEN
Sperm: NONE SEEN
Yeast Wet Prep HPF POC: NONE SEEN

## 2019-10-04 LAB — HCG, QUANTITATIVE, PREGNANCY: hCG, Beta Chain, Quant, S: 79041 m[IU]/mL — ABNORMAL HIGH (ref <5)

## 2019-10-04 MED ORDER — METOCLOPRAMIDE HCL 10 MG PO TABS: 10.0000 mg | ORAL_TABLET | Freq: Three times a day (TID) | ORAL | 0 refills | Status: DC | PRN

## 2019-10-04 MED ORDER — LACTATED RINGERS IV BOLUS
1000.0000 mL | Freq: Once | INTRAVENOUS | Status: AC
  Administered 2019-10-04: 19:00:00 1000 mL via INTRAVENOUS

## 2019-10-04 MED ORDER — METRONIDAZOLE 500 MG PO TABS
2000.0000 mg | ORAL_TABLET | Freq: Once | ORAL | Status: AC
  Administered 2019-10-04: 2000 mg via ORAL
  Filled 2019-10-04: qty 4

## 2019-10-04 MED ORDER — ONDANSETRON 4 MG PO TBDP
8.0000 mg | ORAL_TABLET | Freq: Once | ORAL | Status: AC
  Administered 2019-10-04: 8 mg via ORAL
  Filled 2019-10-04: qty 2

## 2019-10-04 NOTE — MAU Note (Signed)
Sent up from ED 

## 2019-10-04 NOTE — ED Provider Notes (Signed)
MSE was initiated and I personally evaluated the patient and placed orders (if any) at  12:39 PM on October 04, 2019.  28yo female reports LMP end of January, no prenatal care, now with vomiting for the past few days, feeling weak today with light pink vaginal discharge and lower abdominal cramping.  G"multiple"P1  Mildly tachycardic at 102, afebrile, vitals otherwise stable. No abdominal tenderness.  Awaiting positive hcg to call MAU APP.  1314hrs, positive HCG, call to [place], MAU APP, patient will be seen in MAU.  The patient appears stable so that the remainder of the MSE may be completed by another provider.   Jeannie Fend, PA-C 10/04/19 1315    Lorre Nick, MD 10/06/19 1710

## 2019-10-04 NOTE — MAU Provider Note (Addendum)
History     CSN: [id number]  Arrival date and time: 10/04/19 1218   First Provider Initiated Contact with Patient 10/04/19 1810      Chief Complaint  Patient presents with  . Emesis  . Vaginal Bleeding  . Abdominal Pain   [name] is a 29 y.o. [phone] at [redacted]w[redacted]d who presents to MAU for vaginal bleeding which began 2 days ago. Pt reports spotting with mucus-like discharge.  Pt also reports N/V which started 2 days ago. Pt has not taken any medication today. Pt reports vomiting x4 in the past 24hrs. Pt reports her daughter had a "stomach bug" that she believes she caught. Pt also reports diarrhea. Pt reports she drank some Sprite in the waiting area and has not vomited since.  Passing blood clots? no Blood soaking clothes? no Lightheaded/dizzy? no Significant pelvic pain or cramping? Bilateral, menstrual-like cramps Passed any tissue? no Hx ectopic pregnancy? 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 Pt has not yet been seen Blood Type? A Positive Allergies? NKDA, banana Current medications? [place] vitamins with iron Current PNC & next appt? Pt requests list of OB providers  Pt denies vaginal discharge/odor/itching. Pt denies abdominal pain, constipation, diarrhea, or urinary problems. Pt denies fever, chills, fatigue, sweating or changes in appetite. Pt denies SOB or chest pain. Pt denies dizziness, HA, light-headedness, weakness.   OB History    Gravida  7   Para  1   Term  1   Preterm  0   AB  5   Living  1     SAB  3   TAB  2   Ectopic  0   Multiple  0   Live Births  1        Obstetric Comments  Patient states she either had PIH or GDM while pregnant, but cannot remember which one        Past Medical History:  Diagnosis Date  . Bronchitis     Past Surgical History:  Procedure Laterality Date  . CESAREAN SECTION    . EYE SURGERY    . INNER EAR SURGERY    . WISDOM TOOTH  EXTRACTION      Family History  Problem Relation Age of Onset  . Cancer Other   . Stroke Other   . Diabetes Other   . Hypertension Other     Social History   Tobacco Use  . Smoking status: Former Smoker    Packs/day: 0.20    Years: 2.00    Pack years: 0.40    Types: Cigarettes    Quit date: 03/17/2019    Years since quitting: 0.5  . Smokeless tobacco: Never Used  . Tobacco comment: Smoked 1-2 cigs per day intermittently, approx 2 yrs total  Substance Use Topics  . Alcohol use: Not Currently    Comment: social  . Drug use: No    Allergies: No Known Allergies  Medications Prior to Admission  Medication Sig Dispense Refill Last Dose  . Prenatal Vit-Fe Fumarate-FA (PRENATAL MULTIVITAMIN) TABS tablet Take 1 tablet by mouth daily at 12 noon.       Review of Systems  Constitutional: Negative for chills, diaphoresis, fatigue and fever.  Eyes: Negative for visual disturbance.  Respiratory: Negative for shortness of breath.   Cardiovascular: Negative for chest pain.  Gastrointestinal: Positive for nausea and vomiting. Negative for abdominal pain, constipation and diarrhea.  Genitourinary: Positive for pelvic pain and vaginal bleeding. Negative for dysuria,  flank pain, frequency, urgency and vaginal discharge.  Neurological: Negative for dizziness, weakness, light-headedness and headaches.   Physical Exam   Blood pressure 133/71, pulse 92, temperature 98.9 F (37.2 C), temperature source Oral, resp. rate 18, height 5\' 5"  (1.651 m), weight 105.2 kg, last menstrual period 07/25/2019, SpO2 100 %, unknown if currently breastfeeding.  Patient Vitals for the past 24 hrs:  BP Temp Temp src Pulse Resp SpO2 Height Weight  10/04/19 1443 133/71 98.9 F (37.2 C) Oral 92 18 100 % 5\' 5"  (1.651 m) 105.2 kg  10/04/19 1229 136/84 99.2 F (37.3 C) Oral (!) 102 18 100 % -- --   Physical Exam  Constitutional: She is oriented to person, place, and time. She appears well-developed and  well-nourished. No distress.  HENT:  Head: Normocephalic and atraumatic.  Respiratory: Effort normal.  GI: Soft.  Neurological: She is alert and oriented to person, place, and time.  Skin: She is not diaphoretic.  Psychiatric: She has a normal mood and affect. Her behavior is normal. Judgment and thought content normal.   Results for orders placed or performed during the hospital encounter of 10/04/19 (from the past 24 hour(s))  I-Stat beta hCG blood, ED     Status: Abnormal   Collection Time: 10/04/19 12:56 PM  Result Value Ref Range   I-stat hCG, quantitative >2,000.0 (H) <5 mIU/mL   Comment 3          Urinalysis, Routine w reflex microscopic     Status: Abnormal   Collection Time: 10/04/19  2:46 PM  Result Value Ref Range   Color, Urine YELLOW YELLOW   APPearance HAZY (A) CLEAR   Specific Gravity, Urine 1.014 1.005 - 1.030   pH 5.0 5.0 - 8.0   Glucose, UA NEGATIVE NEGATIVE mg/dL   Hgb urine dipstick NEGATIVE NEGATIVE   Bilirubin Urine NEGATIVE NEGATIVE   Ketones, ur NEGATIVE NEGATIVE mg/dL   Protein, ur NEGATIVE NEGATIVE mg/dL   Nitrite NEGATIVE NEGATIVE   Leukocytes,Ua MODERATE (A) NEGATIVE   RBC / HPF 0-5 0 - 5 RBC/hpf   WBC, UA 6-10 0 - 5 WBC/hpf   Bacteria, UA FEW (A) NONE SEEN   Squamous Epithelial / LPF 0-5 0 - 5   Trichomonas, UA PRESENT (A) NONE SEEN  CBC     Status: Abnormal   Collection Time: 10/04/19  6:23 PM  Result Value Ref Range   WBC 6.2 4.0 - 10.5 K/uL   RBC 4.24 3.87 - 5.11 MIL/uL   Hemoglobin 12.1 12.0 - 15.0 g/dL   HCT 12/04/19 (L) 12/04/19 - 16.1 %   MCV 84.7 80.0 - 100.0 fL   MCH 28.5 26.0 - 34.0 pg   MCHC 33.7 30.0 - 36.0 g/dL   RDW 09.6 04.5 - 40.9 %   Platelets 262 150 - 400 K/uL   nRBC 0.0 0.0 - 0.2 %  Comprehensive metabolic panel     Status: Abnormal   Collection Time: 10/04/19  6:23 PM  Result Value Ref Range   Sodium 136 135 - 145 mmol/L   Potassium 3.8 3.5 - 5.1 mmol/L   Chloride 103 98 - 111 mmol/L   CO2 25 22 - 32 mmol/L   Glucose, Bld  114 (H) 70 - 99 mg/dL   BUN 7 6 - 20 mg/dL   Creatinine, Ser 91.4 0.44 - 1.00 mg/dL   Calcium 8.8 (L) 8.9 - 10.3 mg/dL   Total Protein 6.7 6.5 - 8.1 g/dL   Albumin 3.2 (L) 3.5 - 5.0  g/dL   AST 16 15 - 41 U/L   ALT 13 0 - 44 U/L   Alkaline Phosphatase 36 (L) 38 - 126 U/L   Total Bilirubin 0.5 0.3 - 1.2 mg/dL   GFR calc non Af Amer >60 >60 mL/min   GFR calc Af Amer >60 >60 mL/min   Anion gap 8 5 - 15  hCG, quantitative, pregnancy     Status: Abnormal   Collection Time: 10/04/19  6:23 PM  Result Value Ref Range   hCG, Beta Chain, Quant, S 79,041 (H) <5 mIU/mL  Wet prep, genital     Status: Abnormal   Collection Time: 10/04/19  6:24 PM   Specimen: Vaginal  Result Value Ref Range   Yeast Wet Prep HPF POC NONE SEEN NONE SEEN   Trich, Wet Prep PRESENT (A) NONE SEEN   Clue Cells Wet Prep HPF POC NONE SEEN NONE SEEN   WBC, Wet Prep HPF POC MODERATE (A) NONE SEEN   Sperm NONE SEEN    US OB LESS THAN 14 WEEKS WITH OB TRANSVAGINAL  Result Date: 10/04/2019 CLINICAL DATA:  Spotting, lower abdominal cramps EXAM: OBSTETRIC <14 WK [place] AND TRANSVAGINAL OB US TECHNIQUE: Both transabdominal and transvaginal ultrasound examinations were performed for complete evaluation of the gestation as well as the maternal uterus, adnexal regions, and pelvic cul-de-sac. Transvaginal technique was performed to assess early pregnancy. COMPARISON:  None. FINDINGS: Intrauterine gestational sac: Single Yolk sac:  Visualized Embryo:  Visualized Cardiac Activity: Visualized Heart Rate: 183 bpm MSD:   mm    w     d CRL:  26.3 mm   9 w   3 d                  [place] EDC: 05/05/2020 Subchorionic hemorrhage:  None visualized. Maternal uterus/adnexae: No adnexal mass or free fluid. IMPRESSION: Nine week 3 day intrauterine pregnancy. Fetal heart rate 183 beats per minute. No acute maternal findings. Electronically Signed   By: Rolm Baptise M.D.   On: 10/04/2019 22:06    MAU Course  Procedures  MDM -r/o ectopic with N/V/diarrhea -  suspect viral illness -UA: hazy/mod leuks/few bacteria,+trich, sending urine for culture -CBC: WNL -CMP: WNL -[place]: pending at time of care transfer -hCG: 79,041 -ABO: A Positive -WetPrep: + trich -GC/CT collected -weight today 105.2kg, 4kg weight gain since 03/2020  -care transferred to Jorje Guild, NP @854PM  Nugent, Gerrie Nordmann, NP  8:54 PM 10/04/2019   Orders Placed This Encounter  Procedures  . Wet prep, genital    Standing Status:   Standing    Number of Occurrences:   1  . Culture, OB Urine    Standing Status:   Standing    Number of Occurrences:   1  . US OB LESS THAN 14 WEEKS WITH OB TRANSVAGINAL    Standing Status:   Standing    Number of Occurrences:   1    Order Specific Question:   Symptom/Reason for Exam    Answer:   Vaginal bleeding in pregnancy [[id number]]  . Urinalysis, Routine w reflex microscopic    Standing Status:   Standing    Number of Occurrences:   1  . CBC    Standing Status:   Standing    Number of Occurrences:   1  . Comprehensive metabolic panel    Standing Status:   Standing    Number of Occurrences:   1  . hCG, quantitative, pregnancy    Standing Status:  Standing    Number of Occurrences:   1  . I-Stat beta hCG blood, ED    Standing Status:   Standing    Number of Occurrences:   1  . Insert peripheral IV    Standing Status:   Standing    Number of Occurrences:   1  . Discharge patient    Order Specific Question:   Discharge disposition    Answer:   01-Home or Self Care [1]    Order Specific Question:   Discharge patient date    Answer:   10/04/2019   Meds ordered this encounter  Medications  . lactated ringers bolus 1,000 mL  . ondansetron (ZOFRAN-ODT) disintegrating tablet 8 mg  . metroNIDAZOLE (FLAGYL) tablet 2,000 mg  . metoCLOPramide (REGLAN) 10 MG tablet    Sig: Take 1 tablet (10 mg total) by mouth every 8 (eight) hours as needed for nausea.    Dispense:  30 tablet    Refill:  0    Order Specific Question:   Supervising  Provider    Answer:   Malachy Chamber [[id number]]       Ultrasound shows live IUP consistent with EDD.  Zofran & flagyl given in MAU Patient given expedited partner therapy treatment & info for trichomonas   Assessment and Plan  A:  1. Trichomonal vaginitis during pregnancy in first trimester   2. Vaginal bleeding in pregnancy   3. Normal IUP (intrauterine pregnancy) on prenatal ultrasound, first trimester   4. [redacted] weeks gestation of pregnancy   5. Nausea and vomiting during pregnancy prior to [redacted] weeks gestation    P: Discharge home Rx reglan EPT rx & info given for partner GC/CT pending Start prenatal care - given list of providers  Judeth Horn, NP

## 2019-10-04 NOTE — ED Triage Notes (Signed)
Pt reports 2 days of emesis, vaginal pain and discharge. Pt pregnant but unknown gestation.

## 2019-10-04 NOTE — Discharge Instructions (Signed)
Prenatal Care Providers           [facility] [place] @ [place]   Phone: [phone]  [facility] for [place] @ [place]   Phone: [place] [facility]       Phone: [phone]            [facility] [place] @ [place]     Phone: [place] [facility] [place] [facility] [employer]   Phone: [place] [facility] [place] [facility] [place]  Phone: [place] [facility] [place] [facility] Phone: [place] [facility]  Phone: [facility]  Phone: [phone]  [facility] Phone: [phone]  [facility] Phone: [phone]  [facility] Phone: [phone]  [facility] Phone: [phone]  [facility]  Phone: [phone]  Trichomoniasis Trichomoniasis is an STI (sexually transmitted infection) that can affect both women and men. In women, the outer area of the female genitalia (vulva) and the vagina are affected. In men, mainly the penis is affected, but the prostate and other reproductive organs can also be involved.  This condition can be treated with medicine. It often has no symptoms (is asymptomatic), especially in men. If not treated, trichomoniasis can last for months or years. What are the causes? This condition is caused by a parasite called Trichomonas vaginalis. Trichomoniasis most often spreads from person to person (is contagious) through sexual contact. What increases the risk? The following factors may make you more likely to develop this condition:  Having unprotected sex.  Having sex with a partner who has trichomoniasis.  Having multiple sexual partners.  Having had previous trichomoniasis infections or other STIs. What are the signs or symptoms? In women, symptoms of trichomoniasis include:  Abnormal vaginal discharge that is clear, white,  gray, or yellow-green and foamy and has an unusual "fishy" odor.  Itching and irritation of the vagina and vulva.  Burning or pain during urination or sex.  Redness and swelling of the genitals. In men, symptoms of trichomoniasis include:  Penile discharge that may be foamy or contain pus.  Pain in the penis. This may happen only when urinating.  Itching or irritation inside the penis.  Burning after urination or ejaculation. How is this diagnosed? In women, this condition may be found during a routine Pap test or physical exam. It may be found in men during a routine physical exam. Your health care provider may do tests to help diagnose this infection, such as:  Urine tests (men and women).  The following in women: ? Testing the pH of the vagina. ? A vaginal swab test that checks for the Trichomonas vaginalis parasite. ? Testing vaginal secretions. Your health care provider may test you for other STIs, including HIV (human immunodeficiency virus). How is this treated? This condition is treated with medicine taken by mouth (orally), such as metronidazole or tinidazole, to fight the infection. Your sexual partner(s) also need to be tested and treated.  If you are a woman and you plan to become pregnant or think you may be pregnant, tell your health care provider right away. Some medicines that are used to treat the infection should not be taken during pregnancy. Your health care provider may recommend over-the-counter medicines or creams to help relieve itching or irritation. You may be tested for infection again 3 months after treatment. Follow these  instructions at home:  Take and use over-the-counter and prescription medicines, including creams, only as told by your health care provider.  Take your antibiotic medicine as told by your health care provider. Do not stop taking the antibiotic even if you start to feel better.  Do not have sex until 7-10 days after you finish your  medicine, or until your health care provider approves. Ask your health care provider when you may start to have sex again.  (Women) Do not douche or wear tampons while you have the infection.  Discuss your infection with your sexual partner(s). Make sure that your partner gets tested and treated, if necessary.  Keep all follow-up visits as told by your health care provider. This is important. How is this prevented?   Use condoms every time you have sex. Using condoms correctly and consistently can help protect against STIs.  Avoid having multiple sexual partners.  Talk with your sexual partner about any symptoms that either of you may have, as well as any history of STIs.  Get tested for STIs and STDs (sexually transmitted diseases) before you have sex. Ask your partner to do the same.  Do not have sexual contact if you have symptoms of trichomoniasis or another STI. Contact a health care provider if:  You still have symptoms after you finish your medicine.  You develop pain in your abdomen.  You have pain when you urinate.  You have bleeding after sex.  You develop a rash.  You feel nauseous or you vomit.  You plan to become pregnant or think you may be pregnant. Summary  Trichomoniasis is an STI (sexually transmitted infection) that can affect both women and men.  This condition often has no symptoms (is asymptomatic), especially in men.  Without treatment, this condition can last for months or years.  You should not have sex until 7-10 days after you finish your medicine, or until your health care provider approves. Ask your health care provider when you may start to have sex again.  Discuss your infection with your sexual partner(s). Make sure that your partner gets tested and treated, if necessary. This information is not intended to replace advice given to you by your health care provider. Make sure you discuss any questions you have with your health care  provider. Document Revised: 03/27/2018 Document Reviewed: 03/27/2018 Elsevier Patient Education  2020 [employer].

## 2019-10-04 NOTE — MAU Note (Signed)
Been sick for the past 2 days, can't keep anything down.started having hot flashes and past out last night.  Started spotting yesterday, pink with mucous.  Hurting in lower abd.  +HPT in ? Feb, waiting on medicaid.  Temp last night was 101.3.

## 2019-10-07 LAB — GC/CHLAMYDIA PROBE AMP (~~LOC~~) NOT AT ARMC
Chlamydia: NEGATIVE
Comment: NEGATIVE
Comment: NORMAL
Neisseria Gonorrhea: NEGATIVE

## 2019-10-09 LAB — CULTURE, OB URINE: Culture: >=100000 — AB

## 2019-11-23 ENCOUNTER — Other Ambulatory Visit: Payer: Self-pay

## 2019-11-23 ENCOUNTER — Inpatient Hospital Stay (HOSPITAL_COMMUNITY)
Admission: AD | Admit: 2019-11-23 | Discharge: 2019-11-23 | Disposition: A | Discharge status: Home / Self Care | Payer: Medicaid Other | Source: Ambulatory Visit | Attending: Obstetrics and Gynecology | Admitting: Obstetrics and Gynecology

## 2019-11-23 ENCOUNTER — Encounter (HOSPITAL_COMMUNITY): Payer: Self-pay | Admitting: Obstetrics and Gynecology

## 2019-11-23 DIAGNOSIS — R112 Nausea with vomiting, unspecified: Secondary | ICD-10-CM | POA: Diagnosis present

## 2019-11-23 DIAGNOSIS — Z3A17 17 weeks gestation of pregnancy: Secondary | ICD-10-CM | POA: Insufficient documentation

## 2019-11-23 DIAGNOSIS — O26892 Other specified pregnancy related conditions, second trimester: Secondary | ICD-10-CM | POA: Insufficient documentation

## 2019-11-23 DIAGNOSIS — R432 Parageusia: Secondary | ICD-10-CM

## 2019-11-23 DIAGNOSIS — O219 Vomiting of pregnancy, unspecified: Secondary | ICD-10-CM | POA: Diagnosis not present

## 2019-11-23 DIAGNOSIS — R12 Heartburn: Secondary | ICD-10-CM | POA: Insufficient documentation

## 2019-11-23 DIAGNOSIS — Z87891 Personal history of nicotine dependence: Secondary | ICD-10-CM | POA: Diagnosis not present

## 2019-11-23 LAB — URINALYSIS, ROUTINE W REFLEX MICROSCOPIC
Bilirubin Urine: NEGATIVE
Glucose, UA: NEGATIVE mg/dL
Ketones, ur: NEGATIVE mg/dL
Nitrite: NEGATIVE
Protein, ur: NEGATIVE mg/dL
Specific Gravity, Urine: 1.011 (ref 1.005–1.030)
pH: 5 (ref 5.0–8.0)

## 2019-11-23 MED ORDER — FAMOTIDINE 20 MG PO TABS: 20.0000 mg | ORAL_TABLET | Freq: Two times a day (BID) | ORAL | 0 refills | Status: DC

## 2019-11-23 MED ORDER — DOXYLAMINE-PYRIDOXINE 10-10 MG PO TBEC: 2.0000 | DELAYED_RELEASE_TABLET | Freq: Every day | ORAL | 1 refills | Status: DC

## 2019-11-23 NOTE — Discharge Instructions (Signed)
[facility] Prenatal Care Providers   [facility] for [employer] at [employer] [facility]    Phone: [phone]  Center for [facility] at [place] Phone: [phone]  Center for [facility] at [place]  Phone: [phone]  [facility] at [facility]  Phone: [phone]  Eating Plan for Pregnant Women While you are pregnant, your body requires additional nutrition to help support your growing baby. You also have a higher need for some vitamins and minerals, such as folic acid, calcium, iron, and vitamin D. Eating a healthy, well-balanced diet is very important for your health and your baby's health. Your need for extra calories varies for the three 20-month segments of your pregnancy (trimesters). For most women, it is recommended to consume:  150 extra calories a day during the first trimester.  300 extra calories a day during the second trimester.  300 extra calories a day during the third trimester. What are tips for following this plan?   Do not try to lose weight or go on a diet during pregnancy.  Limit your overall intake of foods that have "empty calories." These are foods that have little nutritional value, such as sweets, desserts, candies, and sugar-sweetened beverages.  Eat a variety of foods (especially fruits and vegetables) to get a full range of vitamins and minerals.  Take a prenatal vitamin to help meet your additional vitamin and mineral needs during pregnancy, specifically for folic acid, iron, calcium, and vitamin D.  Remember to stay active. Ask your health care provider what types of exercise and activities are safe for you.  Practice good food safety and cleanliness. Wash your hands before you eat and after you prepare raw meat. Wash all fruits and vegetables well before peeling or eating. Taking these actions can help to prevent food-borne illnesses that can be very dangerous to your baby, such as listeriosis. Ask  your health care provider for more information about listeriosis. What does 150 extra calories look like? Healthy options that provide 150 extra calories each day could be any of the following:  6-8 oz (170-230 g) of plain low-fat yogurt with  cup of berries.  1 apple with 2 teaspoons (11 g) of peanut butter.  Cut-up vegetables with  cup (60 g) of hummus.  8 oz (230 mL) or 1 cup of low-fat chocolate milk.  1 stick of string cheese with 1 medium orange.  1 peanut butter and jelly sandwich that is made with one slice of whole-wheat bread and 1 tsp (5 g) of peanut butter. For 300 extra calories, you could eat two of those healthy options each day. What is a healthy amount of weight to gain? The right amount of weight gain for you is based on your BMI before you became pregnant. If your BMI:  Was less than 18 (underweight), you should gain 28-40 lb (13-18 kg).  Was 18-24.9 (normal), you should gain 25-35 lb (11-16 kg).  Was 25-29.9 (overweight), you should gain 15-25 lb (7-11 kg).  Was 30 or greater (obese), you should gain 11-20 lb (5-9 kg). What if I am having twins or multiples? Generally, if you are carrying twins or multiples:  You may need to eat 300-600 extra calories a day.  The recommended range for total weight gain is 25-54 lb (11-25 kg), depending on your BMI before pregnancy.  Talk with your health care provider to find out about nutritional needs, weight gain, and exercise that is right for you. What foods can I eat?  Fruits  All fruits. Eat a variety of colors and types of fruit. Remember to wash your fruits well before peeling or eating. Vegetables All vegetables. Eat a variety of colors and types of vegetables. Remember to wash your vegetables well before peeling or eating. Grains All grains. Choose whole grains, such as whole-wheat bread, oatmeal, or brown rice. Meats and other protein foods Lean meats, including chicken, [place], fish, and lean cuts of beef,  veal, or pork. If you eat fish or seafood, choose options that are higher in omega-3 fatty acids and lower in mercury, such as salmon, herring, mussels, trout, sardines, pollock, shrimp, crab, and lobster. Tofu. Tempeh. Beans. Eggs. Peanut butter and other nut butters. Make sure that all meats, poultry, and eggs are cooked to food-safe temperatures or "well-done." Two or more servings of fish are recommended each week in order to get the most benefits from omega-3 fatty acids that are found in seafood. Choose fish that are lower in mercury. You can find more information online:  GuamGaming.ch Dairy Pasteurized milk and milk alternatives (such as almond milk). Pasteurized yogurt and pasteurized cheese. Cottage cheese. Sour cream. Beverages Water. Juices that contain 100% fruit juice or vegetable juice. Caffeine-free teas and decaffeinated coffee. Drinks that contain caffeine are okay to drink, but it is better to avoid caffeine. Keep your total caffeine intake to less than 200 mg each day (which is 12 oz or 355 mL of coffee, tea, or soda) or the limit as told by your health care provider. Fats and oils Fats and oils are okay to include in moderation. Sweets and desserts Sweets and desserts are okay to include in moderation. Seasoning and other foods All pasteurized condiments. The items listed above may not be a complete list of foods and beverages you can eat. Contact a dietitian for more information. What foods are not recommended? Fruits Unpasteurized fruit juices. Vegetables Raw (unpasteurized) vegetable juices. Meats and other protein foods Lunch meats, bologna, hot dogs, or other deli meats. (If you must eat those meats, reheat them until they are steaming hot.) Refrigerated pat, meat spreads from a meat counter, smoked seafood that is found in the refrigerated section of a store. Raw or undercooked meats, poultry, and eggs. Raw fish, such as sushi or sashimi. Fish that have high mercury  content, such as tilefish, shark, swordfish, and king mackerel. To learn more about mercury in fish, talk with your health care provider or look for online resources, such as:  GuamGaming.ch Dairy Raw (unpasteurized) milk and any foods that have raw milk in them. Soft cheeses, such as feta, queso blanco, queso fresco, Brie, Camembert cheeses, blue-veined cheeses, and Panela cheese (unless it is made with pasteurized milk, which must be stated on the label). Beverages Alcohol. Sugar-sweetened beverages, such as sodas, teas, or energy drinks. Seasoning and other foods Homemade fermented foods and drinks, such as pickles, sauerkraut, or kombucha drinks. (Store-bought pasteurized versions of these are okay.) Salads that are made in a store or deli, such as ham salad, chicken salad, egg salad, tuna salad, and seafood salad. The items listed above may not be a complete list of foods and beverages you should avoid. Contact a dietitian for more information. Where to find more information To calculate the number of calories you need based on your height, weight, and activity level, you can use an online calculator such as:  MobileTransition.ch To calculate how much weight you should gain during pregnancy, you can use an online pregnancy weight gain calculator such as:  http://jones-berg.com/ Summary  While you are pregnant, your body requires additional nutrition to help support your growing baby.  Eat a variety of foods, especially fruits and vegetables to get a full range of vitamins and minerals.  Practice good food safety and cleanliness. Wash your hands before you eat and after you prepare raw meat. Wash all fruits and vegetables well before peeling or eating. Taking these actions can help to prevent food-borne illnesses, such as listeriosis, that can be very dangerous to your baby.  Do not eat raw meat or fish. Do not eat fish that have high mercury  content, such as tilefish, shark, swordfish, and king mackerel. Do not eat unpasteurized (raw) dairy.  Take a prenatal vitamin to help meet your additional vitamin and mineral needs during pregnancy, specifically for folic acid, iron, calcium, and vitamin D. This information is not intended to replace advice given to you by your health care provider. Make sure you discuss any questions you have with your health care provider. Document Revised: 11/01/2018 Document Reviewed: 03/10/2017 Elsevier Patient Education  2020 [employer].   [facility] for [employer] at [place]  Phone: [phone]  [facility]       Phone: [phone]  [facility]    Phone: [phone]   [facility] [place])    Phone: [phone]  [facility]    Phone: [phone]  [facility]    Phone: [phone]   [facility]  Phone: [phone]  [facility]    Phone: [phone]  [facility] [place]   Phone: [phone]  [facility]    Phone: [phone]

## 2019-11-23 NOTE — MAU Provider Note (Signed)
History     CSN: [id number]  Arrival date and time: 11/23/19 0351   First Provider Initiated Contact with Patient 11/23/19 0421      Chief Complaint  Patient presents with  . Emesis   Teresa Bradley is a 29 y.o. [id number] at [redacted]w[redacted]d who receives [facility] at [facility].  She presents today for Emesis.  She states she had two incidents of vomiting yesterday and has been taking Zofran with relief.  She states she was able to eat Ruby Tuesday and had Bourbon Chicken, Broccoli, Loaded Mashed Potatoes. However, patient states she did not eat it all.  Patient denies current nausea and states she only feels nauseous when she eats.  Patient also reports a metallic taste in her mouth that alters the taste of her food and drinks.      OB History    Gravida  7   Para  1   Term  1   Preterm  0   AB  5   Living  1     SAB  3   TAB  2   Ectopic  0   Multiple  0   Live Births  1        Obstetric Comments  Patient states she either had PIH or GDM while pregnant, but cannot remember which one        Past Medical History:  Diagnosis Date  . Bronchitis     Past Surgical History:  Procedure Laterality Date  . CESAREAN SECTION    . EYE SURGERY    . INNER EAR SURGERY    . WISDOM TOOTH EXTRACTION      Family History  Problem Relation Age of Onset  . Cancer Other   . Stroke Other   . Diabetes Other   . Hypertension Other     Social History   Tobacco Use  . Smoking status: Former Smoker    Packs/day: 0.20    Years: 2.00    Pack years: 0.40    Types: Cigarettes    Quit date: 03/17/2019    Years since quitting: 0.6  . Smokeless tobacco: Never Used  . Tobacco comment: Smoked 1-2 cigs per day intermittently, approx 2 yrs total  Substance Use Topics  . Alcohol use: Not Currently    Comment: social  . Drug use: No    Allergies: No Known Allergies  Medications Prior to Admission  Medication Sig Dispense Refill Last Dose  . metoCLOPramide  (REGLAN) 10 MG tablet Take 1 tablet (10 mg total) by mouth every 8 (eight) hours as needed for nausea. 30 tablet 0   . Prenatal Vit-Fe Fumarate-FA (PRENATAL MULTIVITAMIN) TABS tablet Take 1 tablet by mouth daily at 12 noon.       Review of Systems  Constitutional: Negative for chills and fever.  Respiratory: Negative for cough and shortness of breath.   Gastrointestinal: Positive for constipation and nausea. Negative for abdominal pain, diarrhea and vomiting.  Genitourinary: Negative for difficulty urinating, dysuria, vaginal bleeding and vaginal discharge.  Neurological: Negative for dizziness, light-headedness and headaches.   Physical Exam   Last menstrual period 07/25/2019, unknown if currently breastfeeding.  Physical Exam  Constitutional: She is oriented to person, place, and time. She appears well-developed and well-nourished. No distress.  HENT:  Head: Normocephalic and atraumatic.  Eyes: Conjunctivae are normal.  Cardiovascular: Normal rate.  Respiratory: Effort normal.  Musculoskeletal:        General: No edema. Normal range of motion.  Cervical back: Normal range of motion.  Neurological: She is alert and oriented to person, place, and time.  Skin: Skin is warm and dry.  Psychiatric: She has a normal mood and affect. Her behavior is normal.    MAU Course  Procedures Patient informed that the ultrasound is considered a limited OB ultrasound and is not intended to be a complete ultrasound exam.  Patient also informed that the ultrasound is not being completed with the intent of assessing for fetal or placental anomalies or any pelvic abnormalities.  Explained that the purpose of today's ultrasound is to assess for  FHR.  Patient acknowledges the purpose of the exam and the limitations of the study.   MDM BSUS Education Limited Exam Assessment and Plan  29 year old [id number] SIUP at 17.2 weeks N/V Dysgeusia Heartburn  -Educated on nausea and vomiting during  pregnancy.  Informed that this is an expected discomfort and can occur all throughout the pregnancy.  -Discussed usage of antiemetic (Diclegis) to decrease ongoing nausea and vomiting. Reviewed increasing dosage and using Reglan for breakthrough nausea.  -Informed that Zofran can cause constipation and should use intermittently or discontinue fully. -Recommended small frequent meals throughout the day. -Rx for Diclegis and Pepcid. -Reassured that fetal movement is noted and HR is normal range after BSUS performed.  -Given list of providers in the community. -Encouraged to call or return to MAU if symptoms worsen or with the onset of new symptoms. -Discharged to home in stable condition.  Maryann Conners 11/23/2019, 4:22 AM

## 2019-11-23 NOTE — MAU Note (Signed)
Reports not nauseated now but is if tries to eat.  Vomited twice. Ate at [facility] Tuesday earlier, was not able to eat all of meal. No bleeding. No leaking.

## 2020-06-12 IMAGING — US US OB < 14 WEEKS - US OB TV
1 series · 14 of 28 positions shown · non-contrast
Comparison: None.

CLINICAL DATA: Pelvic pain

EXAM:
OBSTETRIC <14 WK US AND TRANSVAGINAL OB US
TECHNIQUE: Both transabdominal and transvaginal ultrasound examinations were
performed for complete evaluation of the gestation as well as the
maternal uterus, adnexal regions, and pelvic cul-de-sac.
Transvaginal technique was performed to assess early pregnancy.

[Series 1: us ob < 14 weeks - us ob tv · 155 acquisitions, 14 frames shown]
[im 6/155]
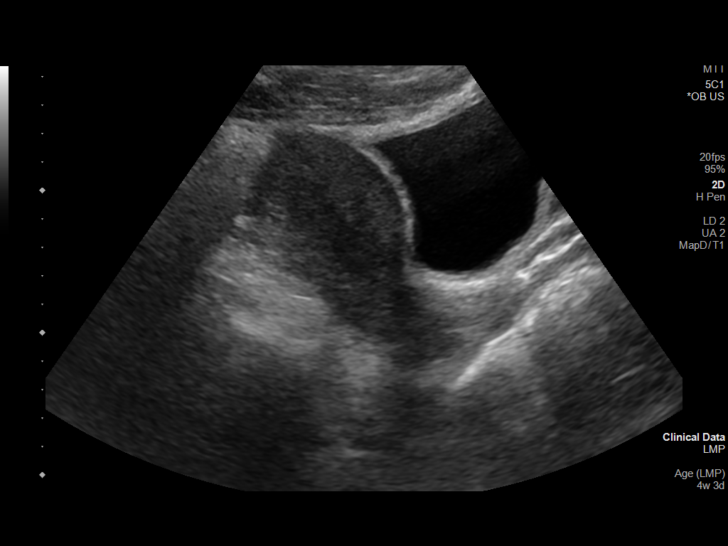
[im 18/155]
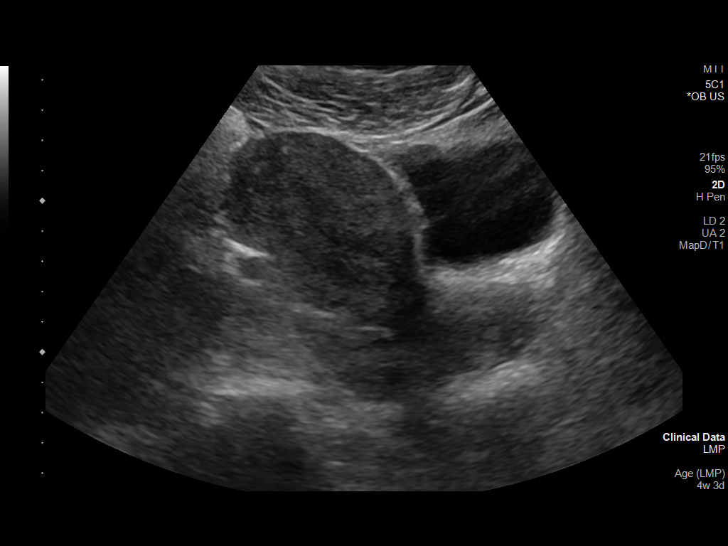
[im 29/155]
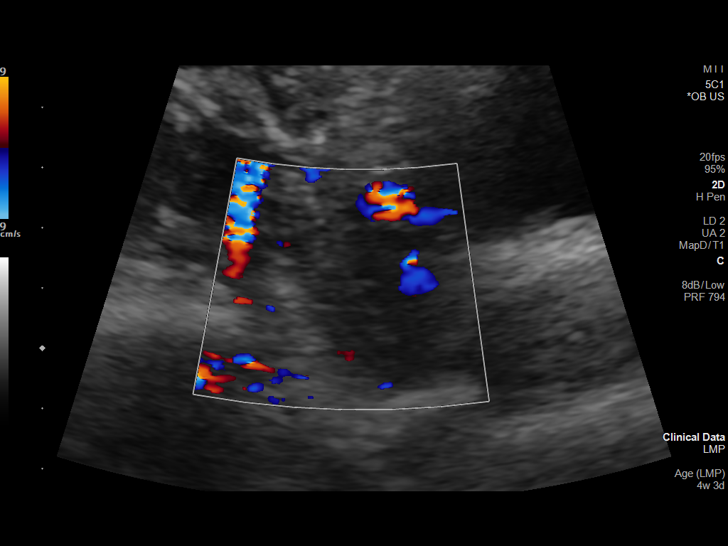
[im 40/155]
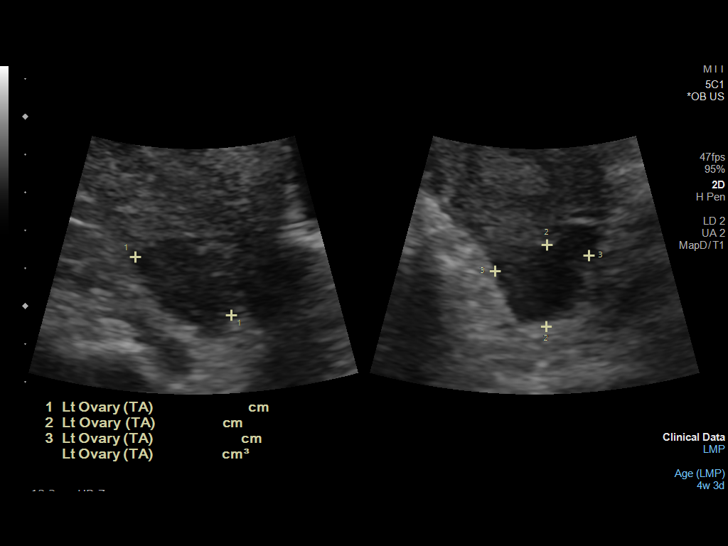
[im 52/155]
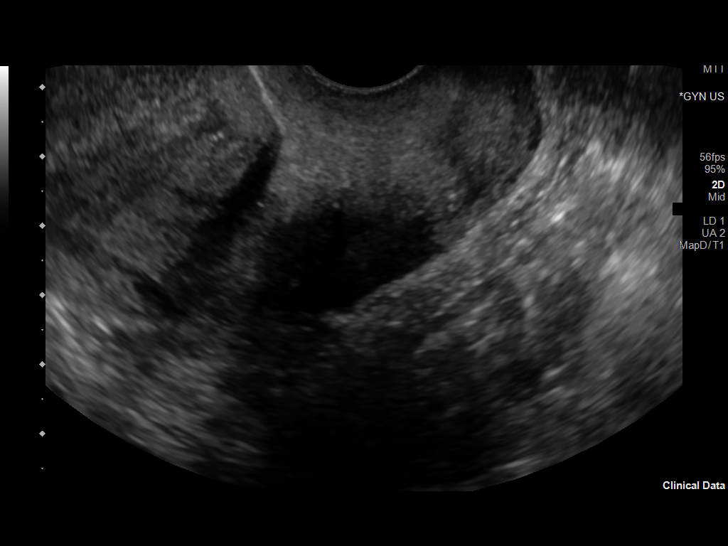
[im 63/155]
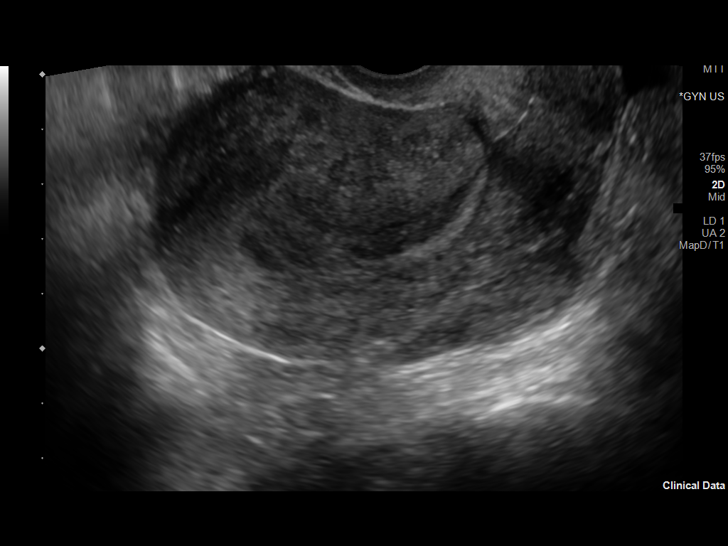
[im 75/155]
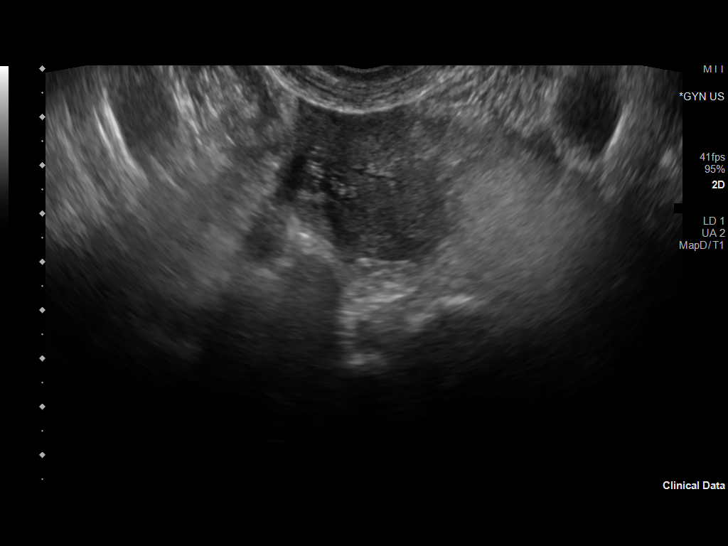
[im 86/155]
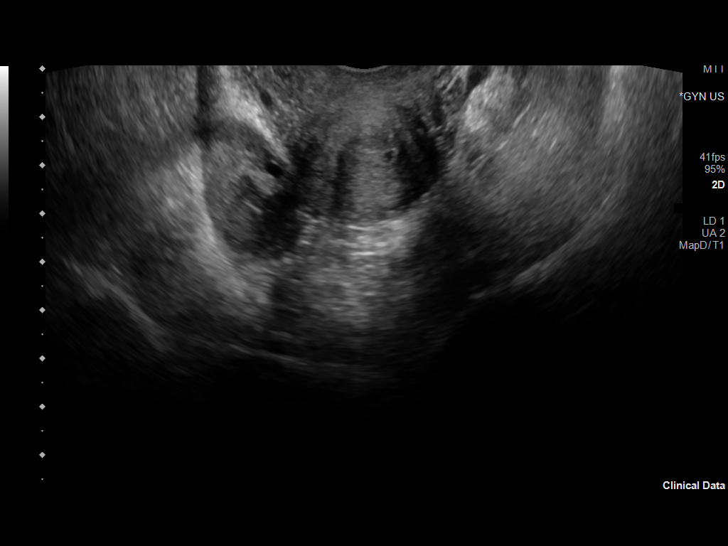
[im 97/155]
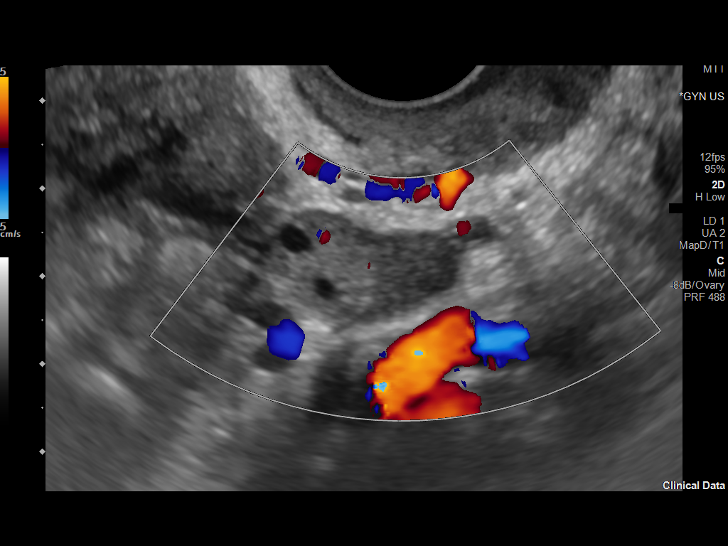
[im 109/155]
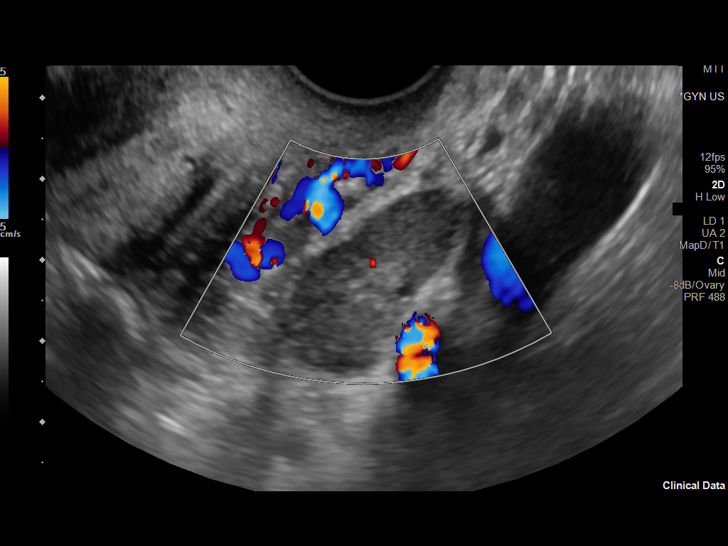
[im 120/155]
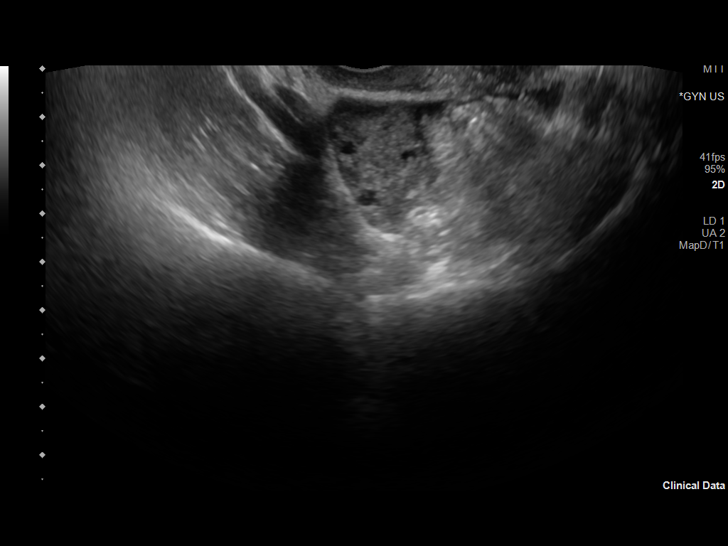
[im 132/155]
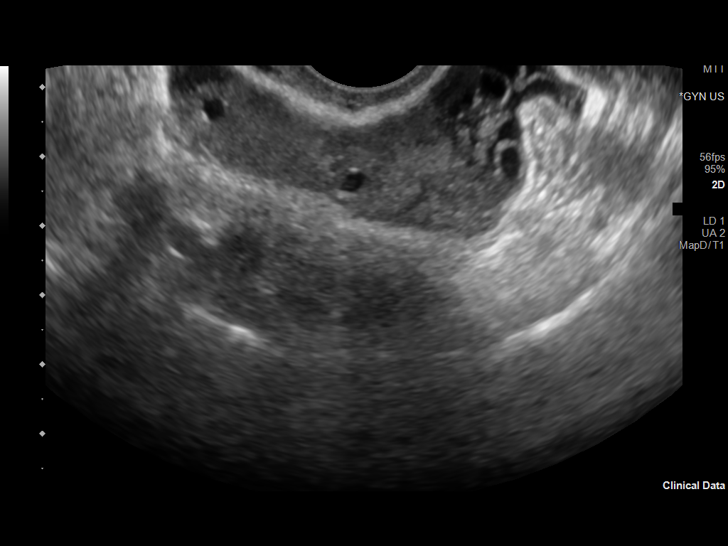
[im 143/155]
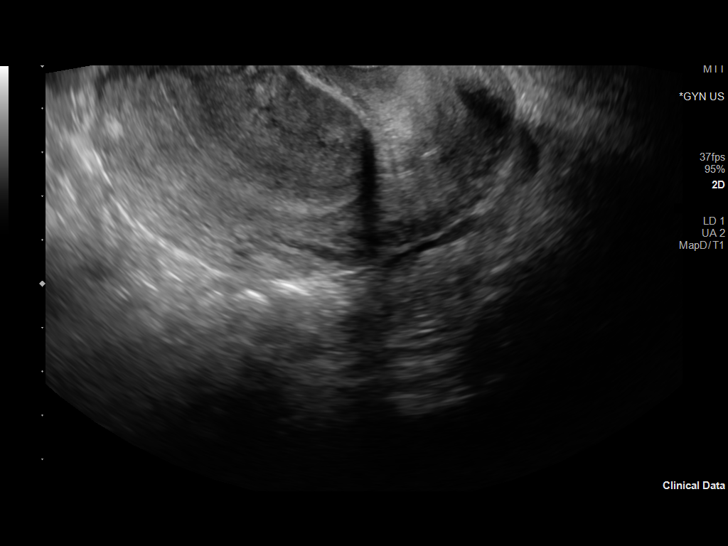
[im 155/155]
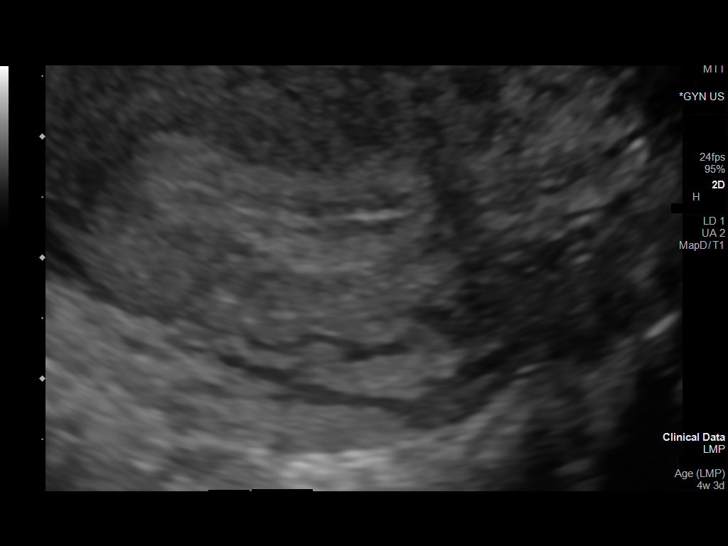

[14 of 28 positions shown; findings below may reference images not displayed]

FINDINGS: Intrauterine gestational sac: Single

Yolk sac:  Not visualized

Embryo:  Not visualized

Cardiac Activity:

Heart Rate:   bpm

MSD: 3.1 mm   5 w   0 d

CRL:    mm    w    d                  US EDC:

Subchorionic hemorrhage:  None visualized.

Maternal uterus/adnexae: No adnexal mass. Small amount of free fluid
in the pelvis.
IMPRESSION: Probable early intrauterine gestational sac, but no yolk sac, fetal
pole, or cardiac activity yet visualized. Recommend follow-up
quantitative B-HCG levels and follow-up US in 14 days to assess
viability. This recommendation follows SRU consensus guidelines:
Diagnostic Criteria for Nonviable Pregnancy Early in the First
Trimester. N Engl J Med 6716; [DATE].

## 2020-11-30 IMAGING — US US OB < 14 WEEKS - US OB TV
1 series · 15 of 28 positions shown · non-contrast
Comparison: None.

CLINICAL DATA: Spotting, lower abdominal cramps

EXAM:
OBSTETRIC <14 WK US AND TRANSVAGINAL OB US
TECHNIQUE: Both transabdominal and transvaginal ultrasound examinations were
performed for complete evaluation of the gestation as well as the
maternal uterus, adnexal regions, and pelvic cul-de-sac.
Transvaginal technique was performed to assess early pregnancy.

[Series 1: us ob < 14 weeks - us ob tv · 15 of 63 slices shown]
[im 1/63]
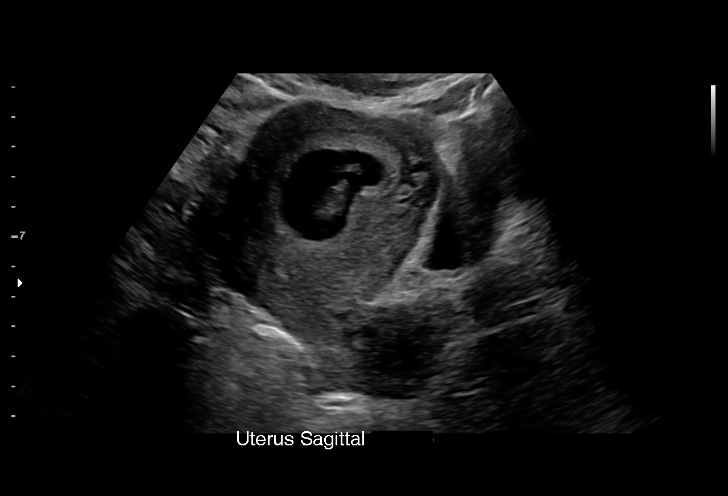
[im 5/63]
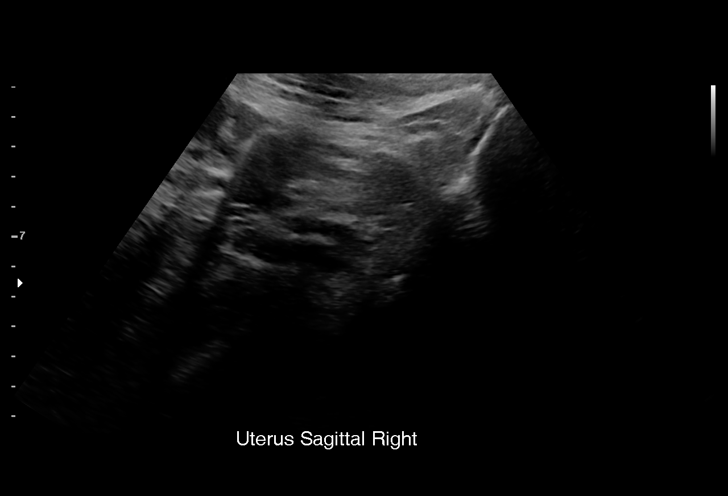
[im 10/63]
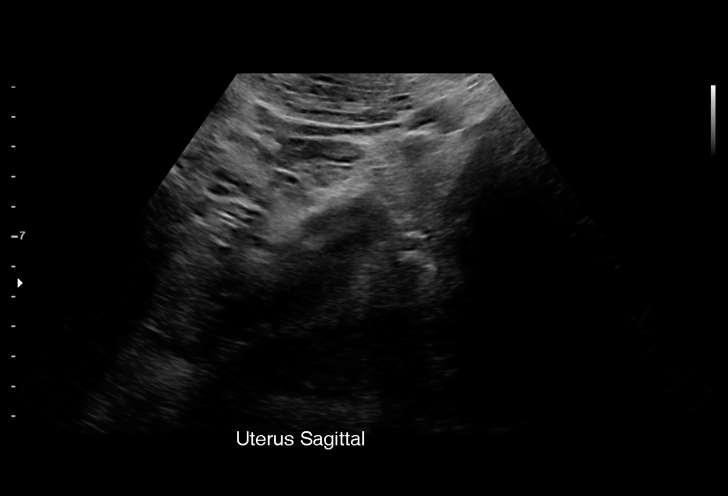
[im 14/63]
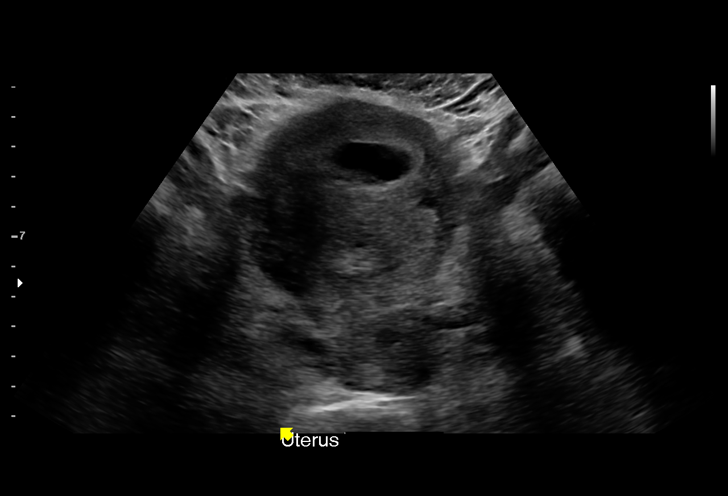
[im 19/63]
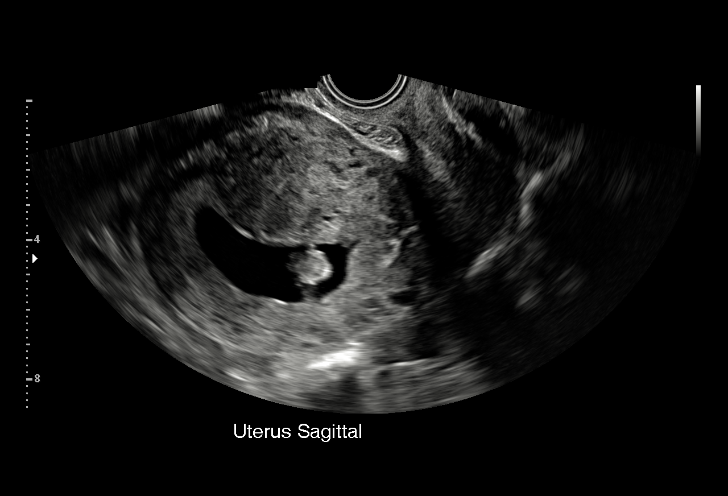
[im 23/63]
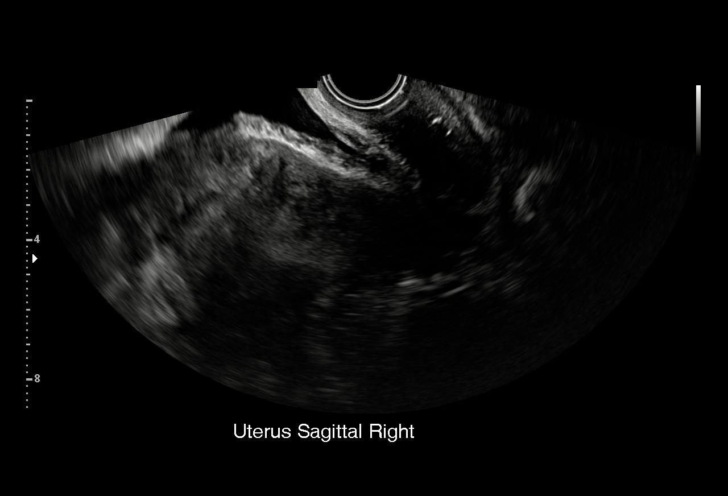
[im 28/63]
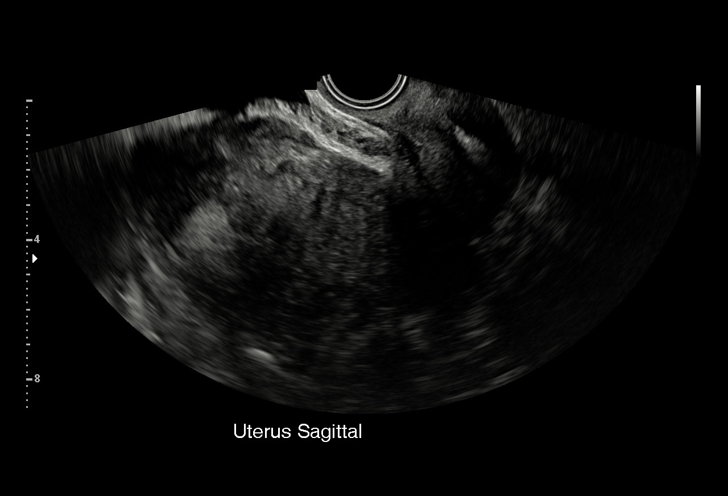
[im 33/63]
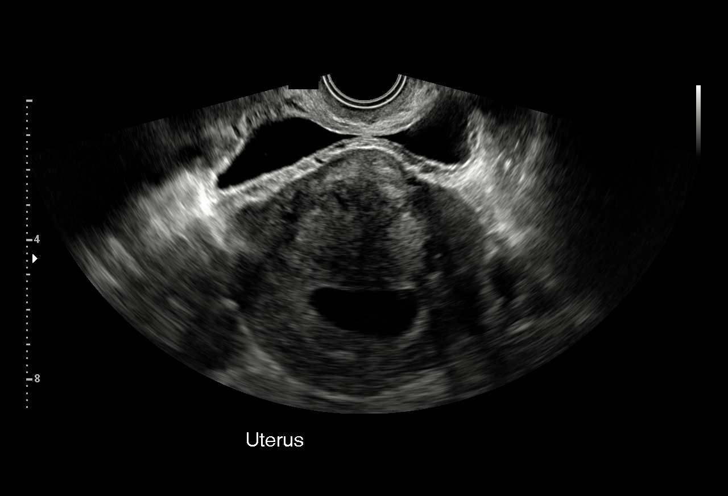
[im 35/63]
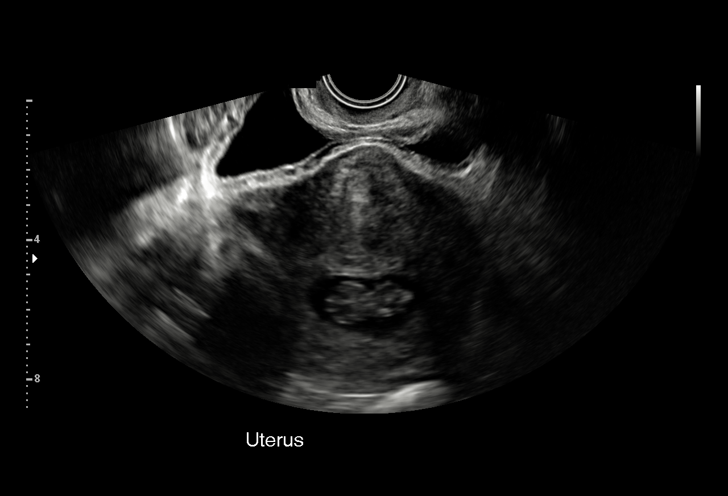
[im 40/63]
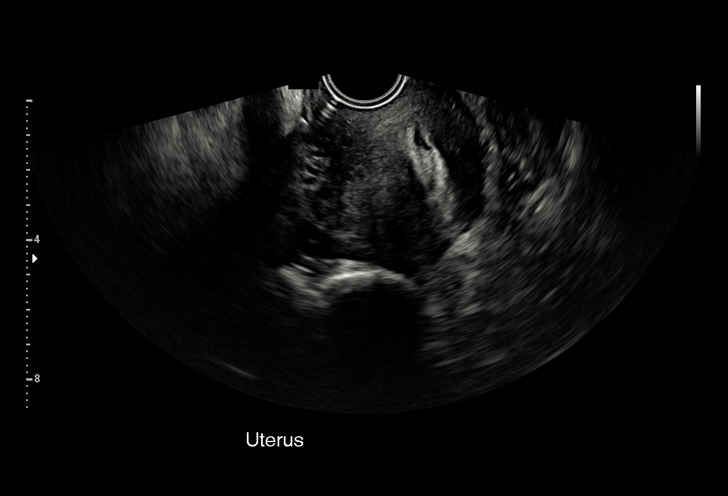
[im 44/63]
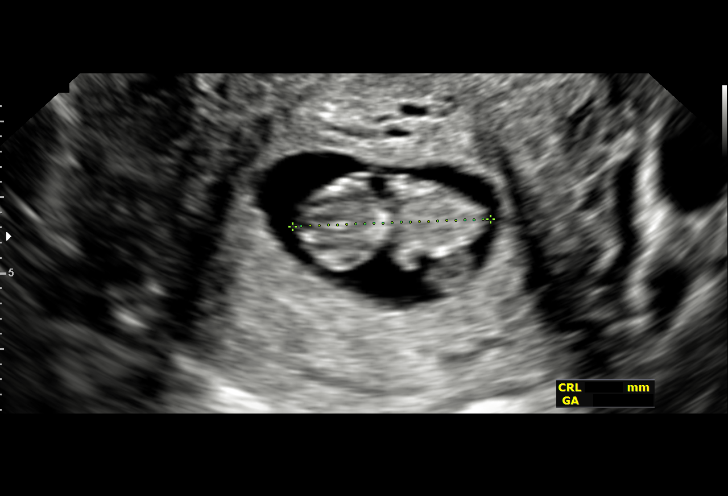
[im 49/63]
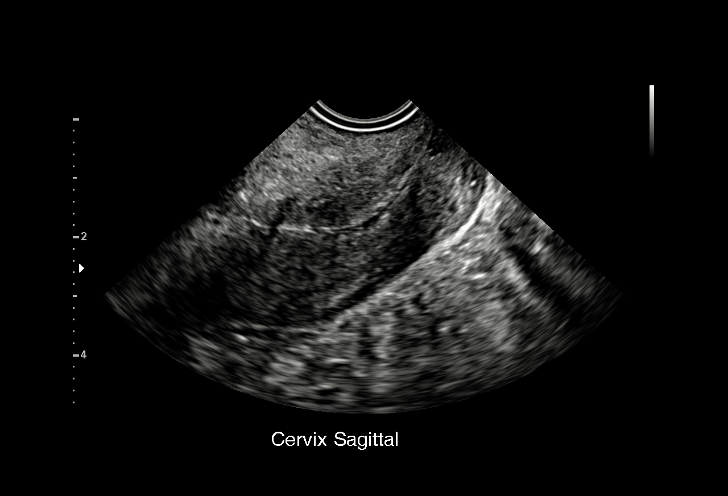
[im 53/63]
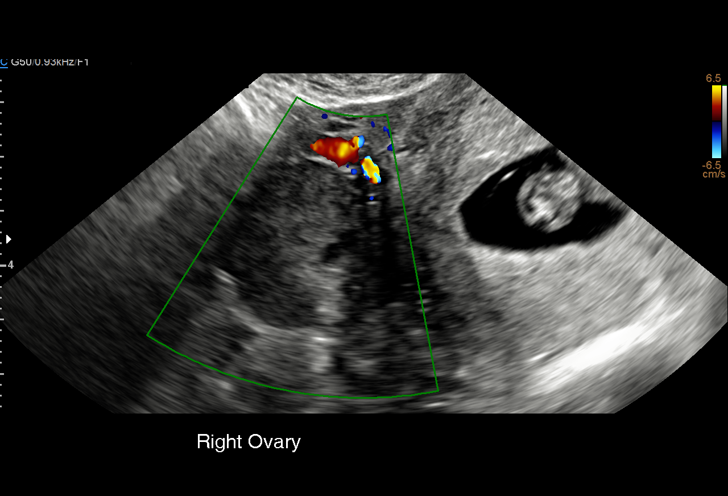
[im 58/63]
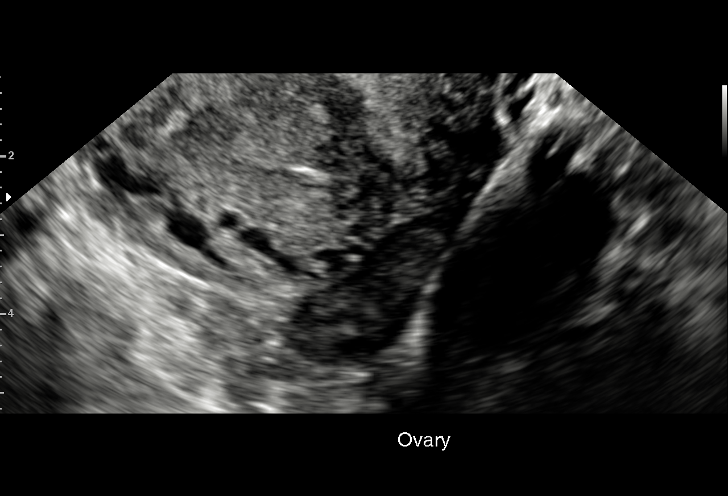
[im 63/63]
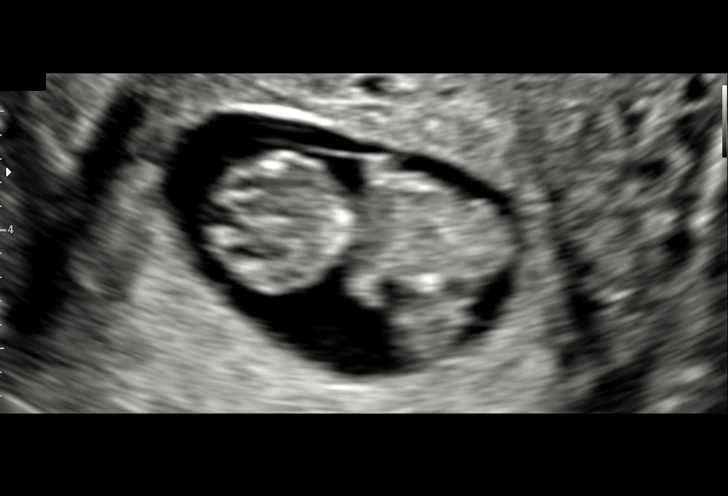

[15 of 28 positions shown; findings below may reference images not displayed]

FINDINGS: Intrauterine gestational sac: Single

Yolk sac:  Visualized

Embryo:  Visualized

Cardiac Activity: Visualized

Heart Rate: 183 bpm

MSD:   mm    w     d

CRL:  26.3 mm   9 w   3 d                  US EDC: 05/05/2020

Subchorionic hemorrhage:  None visualized.

Maternal uterus/adnexae: No adnexal mass or free fluid.
IMPRESSION: Nine week 3 day intrauterine pregnancy. Fetal heart rate 183 beats
per minute. No acute maternal findings.

## 2021-05-01 ENCOUNTER — Inpatient Hospital Stay (HOSPITAL_COMMUNITY)
Admission: AD | Admit: 2021-05-01 | Discharge: 2021-05-01 | Disposition: A | Discharge status: Home / Self Care | Payer: Managed Care, Other (non HMO) | Attending: Obstetrics & Gynecology | Admitting: Obstetrics & Gynecology

## 2021-05-01 ENCOUNTER — Other Ambulatory Visit: Payer: Self-pay

## 2021-05-01 DIAGNOSIS — Z3202 Encounter for pregnancy test, result negative: Secondary | ICD-10-CM | POA: Insufficient documentation

## 2021-05-01 DIAGNOSIS — R0981 Nasal congestion: Secondary | ICD-10-CM | POA: Insufficient documentation

## 2021-05-01 LAB — HCG, QUANTITATIVE, PREGNANCY: hCG, Beta Chain, Quant, S: <1 m[IU]/mL (ref <5)

## 2021-05-01 LAB — POCT PREGNANCY, URINE: Preg Test, Ur: NEGATIVE

## 2021-05-01 NOTE — MAU Provider Note (Signed)
Event Date/Time   First Provider Initiated Contact with Patient 05/01/21 0536      S [name] is a 30 y.o. [phone] patient who presents to MAU today with complaint of congestion, sore throat, and body aches. States she had a positive pregnancy test this morning.  Denies abdominal pain or vaginal bleeding. Denies fever, chest pain, or shortness of breath.    O BP 124/75 (BP Location: Right Arm)   Pulse 79   Temp 97.7 F (36.5 C)   Resp 16   Ht 5' 5.5" (1.664 m)   SpO2 100%   BMI 38.00 kg/m  Physical Exam Vitals and nursing note reviewed.  Constitutional:      Appearance: Normal appearance. She is not ill-appearing.  HENT:     Head: Normocephalic and atraumatic.  Eyes:     General: No scleral icterus. Pulmonary:     Effort: Pulmonary effort is normal. No respiratory distress.  Neurological:     General: No focal deficit present.     Mental Status: She is alert.  Psychiatric:        Mood and Affect: Mood normal.        Behavior: Behavior normal.   Results for orders placed or performed during the hospital encounter of 05/01/21 (from the past 24 hour(s))  Pregnancy, urine POC     Status: None   Collection Time: 05/01/21  4:53 AM  Result Value Ref Range   Preg Test, Ur NEGATIVE NEGATIVE  hCG, quantitative, pregnancy     Status: None   Collection Time: 05/01/21  5:51 AM  Result Value Ref Range   hCG, Beta Chain, Quant, S <1 <5 mIU/mL    A Medical screening exam complete 1. Negative pregnancy test   2. Nasal congestion      P Discharge from MAU in stable condition Discussed meds to take for symptoms Discussed reasons to go to urgent care vs ED  Judeth Horn, NP 05/01/2021 5:36 AM

## 2021-05-01 NOTE — MAU Note (Signed)
Thurs night started having generalized aching, vomiting and nasal congestion. Tried Alka Seltzer Plus and Mucinex.

## 2021-05-01 NOTE — MAU Note (Addendum)
Judeth Horn NP in Triage to talk with pt. PT then d/c home by NP
